# Patient Record
Sex: Male | Born: 1955 | Race: White | Hispanic: No | Smoking: Current some day smoker
Health system: Southern US, Community
[De-identification: ages and names within clinical notes are randomized; demographics above are authoritative.]

## PROBLEM LIST (undated history)

## (undated) DIAGNOSIS — R600 Localized edema: Secondary | ICD-10-CM

## (undated) DIAGNOSIS — G4730 Sleep apnea, unspecified: Secondary | ICD-10-CM

## (undated) DIAGNOSIS — R569 Unspecified convulsions: Secondary | ICD-10-CM

## (undated) DIAGNOSIS — K219 Gastro-esophageal reflux disease without esophagitis: Secondary | ICD-10-CM

## (undated) DIAGNOSIS — J449 Chronic obstructive pulmonary disease, unspecified: Secondary | ICD-10-CM

## (undated) DIAGNOSIS — I10 Essential (primary) hypertension: Secondary | ICD-10-CM

## (undated) DIAGNOSIS — J45909 Unspecified asthma, uncomplicated: Secondary | ICD-10-CM

## (undated) DIAGNOSIS — R0600 Dyspnea, unspecified: Secondary | ICD-10-CM

## (undated) DIAGNOSIS — Z720 Tobacco use: Secondary | ICD-10-CM

## (undated) HISTORY — PX: FOOT SURGERY: SHX648

## (undated) HISTORY — PX: BACK SURGERY: SHX140

## (undated) HISTORY — PX: HERNIA REPAIR: SHX51

---

## 2011-11-21 DIAGNOSIS — R55 Syncope and collapse: Secondary | ICD-10-CM

## 2013-10-17 ENCOUNTER — Ambulatory Visit: Payer: Self-pay | Admitting: Internal Medicine

## 2013-12-31 ENCOUNTER — Emergency Department (HOSPITAL_COMMUNITY): Payer: Self-pay

## 2013-12-31 ENCOUNTER — Inpatient Hospital Stay (HOSPITAL_COMMUNITY)
Admission: EM | Admit: 2013-12-31 | Discharge: 2014-01-02 | DRG: 192 | Disposition: A | Discharge status: Home / Self Care | Payer: Self-pay | Attending: Internal Medicine | Admitting: Internal Medicine

## 2013-12-31 ENCOUNTER — Encounter (HOSPITAL_COMMUNITY): Payer: Self-pay | Admitting: Emergency Medicine

## 2013-12-31 DIAGNOSIS — F17200 Nicotine dependence, unspecified, uncomplicated: Secondary | ICD-10-CM | POA: Diagnosis present

## 2013-12-31 DIAGNOSIS — J441 Chronic obstructive pulmonary disease with (acute) exacerbation: Principal | ICD-10-CM | POA: Diagnosis present

## 2013-12-31 DIAGNOSIS — J45901 Unspecified asthma with (acute) exacerbation: Principal | ICD-10-CM

## 2013-12-31 DIAGNOSIS — D72829 Elevated white blood cell count, unspecified: Secondary | ICD-10-CM | POA: Diagnosis present

## 2013-12-31 DIAGNOSIS — Z66 Do not resuscitate: Secondary | ICD-10-CM | POA: Diagnosis present

## 2013-12-31 DIAGNOSIS — G4730 Sleep apnea, unspecified: Secondary | ICD-10-CM | POA: Diagnosis present

## 2013-12-31 DIAGNOSIS — I10 Essential (primary) hypertension: Secondary | ICD-10-CM | POA: Diagnosis present

## 2013-12-31 DIAGNOSIS — Z23 Encounter for immunization: Secondary | ICD-10-CM

## 2013-12-31 HISTORY — DX: Essential (primary) hypertension: I10

## 2013-12-31 HISTORY — DX: Unspecified asthma, uncomplicated: J45.909

## 2013-12-31 HISTORY — DX: Sleep apnea, unspecified: G47.30

## 2013-12-31 HISTORY — DX: Chronic obstructive pulmonary disease, unspecified: J44.9

## 2013-12-31 LAB — CBC WITH DIFFERENTIAL/PLATELET
BASOS ABS: 0.1 10*3/uL (ref 0.0–0.1)
BASOS PCT: 0 % (ref 0–1)
Eosinophils Absolute: 0.4 10*3/uL (ref 0.0–0.7)
Eosinophils Relative: 2 % (ref 0–5)
HEMATOCRIT: 45.7 % (ref 39.0–52.0)
Hemoglobin: 15.9 g/dL (ref 13.0–17.0)
LYMPHS PCT: 13 % (ref 12–46)
Lymphs Abs: 2.1 10*3/uL (ref 0.7–4.0)
MCH: 31.1 pg (ref 26.0–34.0)
MCHC: 34.8 g/dL (ref 30.0–36.0)
MCV: 89.3 fL (ref 78.0–100.0)
MONO ABS: 1.8 10*3/uL — AB (ref 0.1–1.0)
Monocytes Relative: 12 % (ref 3–12)
NEUTROS PCT: 73 % (ref 43–77)
Neutro Abs: 11.4 10*3/uL — ABNORMAL HIGH (ref 1.7–7.7)
Platelets: 270 10*3/uL (ref 150–400)
RBC: 5.12 MIL/uL (ref 4.22–5.81)
RDW: 13.8 % (ref 11.5–15.5)
WBC: 15.8 10*3/uL — AB (ref 4.0–10.5)

## 2013-12-31 LAB — BASIC METABOLIC PANEL
ANION GAP: 10 (ref 5–15)
BUN: 10 mg/dL (ref 6–23)
CO2: 29 mEq/L (ref 19–32)
CREATININE: 0.9 mg/dL (ref 0.50–1.35)
Calcium: 9.4 mg/dL (ref 8.4–10.5)
Chloride: 94 mEq/L — ABNORMAL LOW (ref 96–112)
Glucose, Bld: 94 mg/dL (ref 70–99)
Potassium: 3.9 mEq/L (ref 3.7–5.3)
SODIUM: 133 meq/L — AB (ref 137–147)

## 2013-12-31 MED ORDER — ALBUTEROL (5 MG/ML) CONTINUOUS INHALATION SOLN
INHALATION_SOLUTION | RESPIRATORY_TRACT | Status: AC
  Filled 2013-12-31: qty 20

## 2013-12-31 MED ORDER — METHYLPREDNISOLONE SODIUM SUCC 125 MG IJ SOLR
125.0000 mg | Freq: Once | INTRAMUSCULAR | Status: AC
  Administered 2013-12-31: 125 mg via INTRAVENOUS
  Filled 2013-12-31: qty 2

## 2013-12-31 MED ORDER — IPRATROPIUM BROMIDE 0.02 % IN SOLN: 0.5000 mg | Freq: Four times a day (QID) | RESPIRATORY_TRACT | Status: DC

## 2013-12-31 MED ORDER — IPRATROPIUM-ALBUTEROL 0.5-2.5 (3) MG/3ML IN SOLN
3.0000 mL | Freq: Once | RESPIRATORY_TRACT | Status: AC
  Administered 2013-12-31: 3 mL via RESPIRATORY_TRACT
  Filled 2013-12-31: qty 3

## 2013-12-31 MED ORDER — ALBUTEROL SULFATE (2.5 MG/3ML) 0.083% IN NEBU
2.5000 mg | INHALATION_SOLUTION | Freq: Once | RESPIRATORY_TRACT | Status: AC
  Administered 2013-12-31: 2.5 mg via RESPIRATORY_TRACT
  Filled 2013-12-31: qty 3

## 2013-12-31 MED ORDER — IPRATROPIUM-ALBUTEROL 0.5-2.5 (3) MG/3ML IN SOLN: 3.0000 mL | Freq: Four times a day (QID) | RESPIRATORY_TRACT | Status: DC

## 2013-12-31 MED ORDER — ENOXAPARIN SODIUM 40 MG/0.4ML ~~LOC~~ SOLN
40.0000 mg | SUBCUTANEOUS | Status: DC
  Administered 2014-01-01 – 2014-01-02 (×2): 40 mg via SUBCUTANEOUS
  Filled 2013-12-31 (×2): qty 0.4

## 2013-12-31 MED ORDER — ONDANSETRON HCL 4 MG PO TABS: 4.0000 mg | ORAL_TABLET | Freq: Four times a day (QID) | ORAL | Status: DC | PRN

## 2013-12-31 MED ORDER — ALBUTEROL SULFATE (2.5 MG/3ML) 0.083% IN NEBU
2.5000 mg | INHALATION_SOLUTION | RESPIRATORY_TRACT | Status: DC | PRN
  Administered 2014-01-01: 2.5 mg via RESPIRATORY_TRACT
  Filled 2013-12-31: qty 3

## 2013-12-31 MED ORDER — DM-GUAIFENESIN ER 30-600 MG PO TB12
1.0000 | ORAL_TABLET | Freq: Two times a day (BID) | ORAL | Status: DC
  Administered 2014-01-01 – 2014-01-02 (×4): 1 via ORAL
  Filled 2013-12-31 (×4): qty 1

## 2013-12-31 MED ORDER — INFLUENZA VAC SPLIT QUAD 0.5 ML IM SUSY
0.5000 mL | PREFILLED_SYRINGE | INTRAMUSCULAR | Status: AC
  Administered 2014-01-01: 0.5 mL via INTRAMUSCULAR
  Filled 2013-12-31: qty 0.5

## 2013-12-31 MED ORDER — IPRATROPIUM-ALBUTEROL 0.5-2.5 (3) MG/3ML IN SOLN
RESPIRATORY_TRACT | Status: AC
  Administered 2013-12-31: 3 mL
  Filled 2013-12-31: qty 3

## 2013-12-31 MED ORDER — ALBUTEROL (5 MG/ML) CONTINUOUS INHALATION SOLN
10.0000 mg/h | INHALATION_SOLUTION | RESPIRATORY_TRACT | Status: DC
  Administered 2013-12-31: 10 mg/h via RESPIRATORY_TRACT

## 2013-12-31 MED ORDER — METHYLPREDNISOLONE SODIUM SUCC 125 MG IJ SOLR
60.0000 mg | Freq: Four times a day (QID) | INTRAMUSCULAR | Status: DC
  Administered 2014-01-01 – 2014-01-02 (×7): 60 mg via INTRAVENOUS
  Filled 2013-12-31 (×8): qty 2

## 2013-12-31 MED ORDER — SODIUM CHLORIDE 0.9 % IV SOLN: 250.0000 mL | INTRAVENOUS | Status: DC | PRN

## 2013-12-31 MED ORDER — SODIUM CHLORIDE 0.9 % IJ SOLN
3.0000 mL | Freq: Two times a day (BID) | INTRAMUSCULAR | Status: DC
  Administered 2014-01-01 – 2014-01-02 (×3): 3 mL via INTRAVENOUS

## 2013-12-31 MED ORDER — SODIUM CHLORIDE 0.9 % IJ SOLN: 3.0000 mL | INTRAMUSCULAR | Status: DC | PRN

## 2013-12-31 MED ORDER — ACETAMINOPHEN 325 MG PO TABS: 650.0000 mg | ORAL_TABLET | Freq: Four times a day (QID) | ORAL | Status: DC | PRN

## 2013-12-31 MED ORDER — ACETAMINOPHEN 650 MG RE SUPP: 650.0000 mg | Freq: Four times a day (QID) | RECTAL | Status: DC | PRN

## 2013-12-31 MED ORDER — LEVOFLOXACIN IN D5W 500 MG/100ML IV SOLN
500.0000 mg | INTRAVENOUS | Status: DC
  Administered 2014-01-01 (×2): 500 mg via INTRAVENOUS
  Filled 2013-12-31 (×3): qty 100

## 2013-12-31 MED ORDER — LEVOFLOXACIN IN D5W 500 MG/100ML IV SOLN
INTRAVENOUS | Status: AC
  Filled 2013-12-31: qty 100

## 2013-12-31 MED ORDER — ONDANSETRON HCL 4 MG/2ML IJ SOLN: 4.0000 mg | Freq: Four times a day (QID) | INTRAMUSCULAR | Status: DC | PRN

## 2013-12-31 MED ORDER — ALBUTEROL SULFATE (2.5 MG/3ML) 0.083% IN NEBU: 2.5000 mg | INHALATION_SOLUTION | Freq: Four times a day (QID) | RESPIRATORY_TRACT | Status: DC

## 2013-12-31 MED ORDER — PNEUMOCOCCAL VAC POLYVALENT 25 MCG/0.5ML IJ INJ
0.5000 mL | INJECTION | INTRAMUSCULAR | Status: AC
  Administered 2014-01-01: 0.5 mL via INTRAMUSCULAR
  Filled 2013-12-31: qty 0.5

## 2013-12-31 NOTE — Progress Notes (Signed)
Patient has an allergy to sudafed and it is contraindicated to give biotene mouth wash.

## 2013-12-31 NOTE — ED Notes (Signed)
Patient has had some difficulty breathing for the past few months. His work of breathing increased last night. Patient has cough present with some sputum production. Pt states he is having chest pain but feels it comes from his increased need to breathe.

## 2013-12-31 NOTE — H&P (Signed)
PCP:   No primary provider on file.   Chief Complaint:  Shortness of breath  HPI:  58 year old male who  has a past medical history of COPD (chronic obstructive pulmonary disease); Asthma; Hypertension; and Sleep apnea. today presents to the ED with chief complaint of shortness of breath for past one week. Patient has a history of COPD and is not very compliant with his home medications as he is unable to afford those medications. He has been having worsening shortness of breath especially on exertion also associated with productive cough and yellow-colored sputum. He denies fever but has been having chills. Denies nausea vomiting or diarrhea. Patient quit smoking 2 weeks ago. He also has hypertension history but has not been taking these medications as prescribed. In the ED patient was found to be in COPD exacerbation and was started on albuterol continuous nebulizers.  Allergies:   Allergies  Allergen Reactions  . Sudafed [Pseudoephedrine Hcl] Rash      Past Medical History  Diagnosis Date  . COPD (chronic obstructive pulmonary disease)   . Asthma   . Hypertension   . Sleep apnea     Past Surgical History  Procedure Laterality Date  . Back surgery    . Hernia repair      Prior to Admission medications   Medication Sig Start Date End Date Taking? Authorizing Provider  tiotropium (SPIRIVA) 18 MCG inhalation capsule Place 18 mcg into inhaler and inhale daily.   Yes Historical Provider, MD    Social History:  reports that he quit smoking 7 days ago. He does not have any smokeless tobacco history on file. He reports that he does not drink alcohol or use illicit drugs.    All the positives are listed in BOLD  Review of Systems:  HEENT: Headache, blurred vision, runny nose, sore throat Neck: Hypothyroidism, hyperthyroidism,,lymphadenopathy Chest : Shortness of breath, history of COPD, Asthma Heart : Chest pain, history of coronary arterey disease GI:  Nausea, vomiting,  diarrhea, constipation, GERD GU: Dysuria, urgency, frequency of urination, hematuria Neuro: Stroke, seizures, syncope Psych: Depression, anxiety, hallucinations   Physical Exam: Blood pressure 144/87, pulse 106, temperature 98.8 F (37.1 C), temperature source Oral, resp. rate 20, height  (1.727 m), weight 86.183 kg (190 lb), SpO2 94.00%. Constitutional:   Patient is a well-developed and well-nourished male* in no acute distress and cooperative with exam. Head: Normocephalic and atraumatic Mouth: Mucus membranes moist Eyes: PERRL, EOMI, conjunctivae normal Neck: Supple, No Thyromegaly Cardiovascular: RRR, S1 normal, S2 normal Pulmonary/Chest: Bilateral wheezing Abdominal: Soft. Non-tender, non-distended, bowel sounds are normal, no masses, organomegaly, or guarding present.  Neurological: A&O x3, Strenght is normal and symmetric bilaterally, cranial nerve II-XII are grossly intact, no focal motor deficit, sensory intact to light touch bilaterally.  Extremities : No Cyanosis, Clubbing or Edema  Labs on Admission:  Basic Metabolic Panel:  Recent Labs Lab 12/31/13 1738  NA 133*  K 3.9  CL 94*  CO2 29  GLUCOSE 94  BUN 10  CREATININE 0.90  CALCIUM 9.4   CBC:  Recent Labs Lab 12/31/13 1738  WBC 15.8*  NEUTROABS 11.4*  HGB 15.9  HCT 45.7  MCV 89.3  PLT 270   Radiological Exams on Admission: Dg Chest 2 View  12/31/2013   CLINICAL DATA:  Shortness of breath  EXAM: CHEST  2 VIEW  COMPARISON:  11/28/2013  FINDINGS: The heart size and mediastinal contours are within normal limits. Both lungs are clear. The visualized skeletal structures are unremarkable.  IMPRESSION: No active cardiopulmonary disease.   Electronically Signed   By: Elige Ko   On: 12/31/2013 20:15    EKG: Independently reviewed. Sinus rhythm   Assessment/Plan Principal Problem:   COPD exacerbation  COPD exacerbation We'll admit the patient to the hospital and start DuoNeb nebulizers every 6  hours, IV Levaquin 500 mg daily,  Solu-Medrol 60 mg IV every 6 hours, Mucinex DM one tablet by mouth twice a day.  History of hypertension Blood pressure is stable, we'll start by mouth hydralazine 25 mg every 6 hours when necessary for BP greater than 160/100.  Code status: Patient is DO NOT RESUSCITATE  Family discussion: No family at bedside   Time Spent on Admission: 55 minutes  Zorina Mallin S [facility] Pager: [phone] 12/31/2013, 10:09 PM  If 7PM-7AM, please contact night-coverage  [email or web address]  Password [email or web address]

## 2013-12-31 NOTE — ED Provider Notes (Signed)
CSN: [id number]     Arrival date & time 12/31/13  1706 History   First MD Initiated Contact with Patient 12/31/13 1717    This chart was scribed for Donnetta Hutching, MD by Marica Otter, ED Scribe. This patient was seen in room APA19/APA19 and the patient's care was started at 5:22 PM.  Chief Complaint  Patient presents with  . Shortness of Breath   The history is provided by the patient. No language interpreter was used.   HPI Comments: PCP: No primary provider on file.  ARVIL UTZ is a 58 y.o. male, with Hx of asthma, cigarette smoker (quit date one week ago), COPD, HTN and sleep apnea, and who presents to the Emergency Department complaining of SOB onset a year ago, however, worsening in the past week. Pt also complains of associated productive cough with discolored sputum. No fever or chills. Patient gets dyspneic with exertion. No chest pain. He has been a long-time smoker. Severity is moderate to severe.  Past Medical History  Diagnosis Date  . COPD (chronic obstructive pulmonary disease)   . Asthma   . Hypertension   . Sleep apnea    Past Surgical History  Procedure Laterality Date  . Back surgery    . Hernia repair     History reviewed. No pertinent family history. History  Substance Use Topics  . Smoking status: Former Smoker    Quit date: 12/24/2013  . Smokeless tobacco: Not on file  . Alcohol Use: No    Review of Systems  A complete 10 system review of systems was obtained and all systems are negative except as noted in the HPI and PMH.    Allergies  Sudafed  Home Medications   Prior to Admission medications   Medication Sig Start Date End Date Taking? Authorizing Provider  tiotropium (SPIRIVA) 18 MCG inhalation capsule Place 18 mcg into inhaler and inhale daily.   Yes Historical Provider, MD   Triage Vitals: BP 164/94  Pulse 95  Temp(Src) 98.8 F (37.1 C) (Oral)  Resp 30  Ht  (1.727 m)  Wt 190 lb (86.183 kg)  BMI 28.90 kg/m2  SpO2  94% Physical Exam  Nursing note and vitals reviewed. Constitutional: He is oriented to person, place, and time. He appears well-developed and well-nourished. No distress.  HENT:  Head: Normocephalic and atraumatic.  Eyes: Conjunctivae and EOM are normal.  Cardiovascular: Normal rate.   Pulmonary/Chest: Effort normal. No respiratory distress. He has wheezes (bilaterally ).  tachypnic and dyspneic.   Musculoskeletal: Normal range of motion.  Neurological: He is alert and oriented to person, place, and time.  Skin: Skin is warm and dry.  Psychiatric: He has a normal mood and affect. His behavior is normal.    ED Course  Procedures (including critical care time) DIAGNOSTIC STUDIES: Oxygen Saturation is 94% on RA, adequate by my interpretation.    COORDINATION OF CARE: 5:27 PM-Discussed treatment plan which includes breathing treatment and meds with pt at bedside and pt agreed to plan.   Labs Review Labs Reviewed  BASIC METABOLIC PANEL - Abnormal; Notable for the following:    Sodium 133 (*)    Chloride 94 (*)    All other components within normal limits  CBC WITH DIFFERENTIAL - Abnormal; Notable for the following:    WBC 15.8 (*)    Neutro Abs 11.4 (*)    Monocytes Absolute 1.8 (*)    All other components within normal limits    Imaging Review Dg Chest  2 View  12/31/2013   CLINICAL DATA:  Shortness of breath  EXAM: CHEST  2 VIEW  COMPARISON:  11/28/2013  FINDINGS: The heart size and mediastinal contours are within normal limits. Both lungs are clear. The visualized skeletal structures are unremarkable.  IMPRESSION: No active cardiopulmonary disease.   Electronically Signed   By: Elige Ko   On: 12/31/2013 20:15     EKG Interpretation None      Date: 12/31/2013  Rate: 95  Rhythm: normal sinus rhythm  QRS Axis: normal  Intervals: normal  ST/T Wave abnormalities: normal  Conduction Disutrbances: none  Narrative Interpretation: unremarkable    MDM   Final  diagnoses:  COPD exacerbation    Patient continues to be dyspneic, tachypnea, wheezing despite a hour-long Albuterol/Atrovent nebulizer treatment. IV steroids. Admit to general medicine  I personally performed the services described in this documentation, which was scribed in my presence. The recorded information has been reviewed and is accurate.    Donnetta Hutching, MD 12/31/13 2147

## 2014-01-01 DIAGNOSIS — I10 Essential (primary) hypertension: Secondary | ICD-10-CM

## 2014-01-01 LAB — URINALYSIS, ROUTINE W REFLEX MICROSCOPIC
Bilirubin Urine: NEGATIVE
Glucose, UA: NEGATIVE mg/dL
Hgb urine dipstick: NEGATIVE
Ketones, ur: NEGATIVE mg/dL
Leukocytes, UA: NEGATIVE
Nitrite: NEGATIVE
Protein, ur: NEGATIVE mg/dL
Specific Gravity, Urine: 1.025 (ref 1.005–1.030)
Urobilinogen, UA: 0.2 mg/dL (ref 0.0–1.0)
pH: 5.5 (ref 5.0–8.0)

## 2014-01-01 LAB — COMPREHENSIVE METABOLIC PANEL WITH GFR
ALT: 19 U/L (ref 0–53)
AST: 12 U/L (ref 0–37)
Albumin: 3.4 g/dL — ABNORMAL LOW (ref 3.5–5.2)
Alkaline Phosphatase: 81 U/L (ref 39–117)
Anion gap: 12 (ref 5–15)
BUN: 10 mg/dL (ref 6–23)
CO2: 26 meq/L (ref 19–32)
Calcium: 9.4 mg/dL (ref 8.4–10.5)
Chloride: 93 meq/L — ABNORMAL LOW (ref 96–112)
Creatinine, Ser: 0.71 mg/dL (ref 0.50–1.35)
GFR calc Af Amer: >90 mL/min
GFR calc non Af Amer: >90 mL/min
Glucose, Bld: 145 mg/dL — ABNORMAL HIGH (ref 70–99)
Potassium: 4.4 meq/L (ref 3.7–5.3)
Sodium: 131 meq/L — ABNORMAL LOW (ref 137–147)
Total Bilirubin: 0.4 mg/dL (ref 0.3–1.2)
Total Protein: 7.2 g/dL (ref 6.0–8.3)

## 2014-01-01 LAB — CBC
HEMATOCRIT: 46.5 % (ref 39.0–52.0)
Hemoglobin: 16.1 g/dL (ref 13.0–17.0)
MCH: 30.7 pg (ref 26.0–34.0)
MCHC: 34.6 g/dL (ref 30.0–36.0)
MCV: 88.6 fL (ref 78.0–100.0)
Platelets: 279 10*3/uL (ref 150–400)
RBC: 5.25 MIL/uL (ref 4.22–5.81)
RDW: 13.7 % (ref 11.5–15.5)
WBC: 14.4 10*3/uL — ABNORMAL HIGH (ref 4.0–10.5)

## 2014-01-01 MED ORDER — IPRATROPIUM-ALBUTEROL 0.5-2.5 (3) MG/3ML IN SOLN
3.0000 mL | Freq: Four times a day (QID) | RESPIRATORY_TRACT | Status: DC
  Administered 2014-01-01 – 2014-01-02 (×6): 3 mL via RESPIRATORY_TRACT
  Filled 2014-01-01 (×6): qty 3

## 2014-01-01 NOTE — Progress Notes (Signed)
[facility]                                                                              Patient Demographics  Troy Bates, is a 58 y.o. male, DOB - 1955/10/18, ZOX:[id number]  Admit date - 12/31/2013   Admitting Physician Meredeth Ide, MD  Outpatient Primary MD for the patient is No primary provider on file.  LOS - 1   Chief Complaint  Patient presents with  . Shortness of Breath      HPI on 12/31/2013 This 58 year old male past medical history of COPD, hypertension, sleep apnea that presented to the emergency department with a chief complaint of shortness of breath for one week. Patient has a history of COPD he does not comply with his home medications and was unable to afford his medications. He had worsening shortness of breath especially on exertion and associated with productive cough, yellow-colored sputum. Patient not having any fevers or chills. He denied any nausea, vomiting, diarrhea. Patient did quit smoking approximately 2 weeks ago. Patient also has a history of hypertension meds not taking his medications. In the emergency department, patient have COPD exacerbation and started on albuterol and continuous nebulizers.  Assessment & Plan   COPD exacerbation -Chest x-ray: No active cardiopulmonary disease -Continue duo nebs, Levaquin, Solu-Medrol, Mucinex  Leukocytosis -Likely reactive, continue to monitor CBC -Will obtain UA and blood cultures to rule out infection  History of Hypertension -Stable, continue hydralazine as needed  Code Status: Full  Family Communication: None at bedside  Disposition Plan: Admitted  Time Spent in minutes   30 minutes  Procedures  None  Consults   None  DVT Prophylaxis  Lovenox  Lab Results  Component Value Date   PLT 279 01/01/2014    Medications  Scheduled Meds: . dextromethorphan-guaiFENesin  1 tablet Oral BID  . enoxaparin (LOVENOX) injection  40 mg Subcutaneous Q24H  . ipratropium-albuterol  3 mL  Nebulization Q6H  . levofloxacin (LEVAQUIN) IV  500 mg Intravenous Q24H  . methylPREDNISolone (SOLU-MEDROL) injection  60 mg Intravenous Q6H  . sodium chloride  3 mL Intravenous Q12H   Continuous Infusions:  PRN Meds:.sodium chloride, acetaminophen, acetaminophen, albuterol, ondansetron (ZOFRAN) IV, ondansetron, sodium chloride  Antibiotics    Anti-infectives   Start     Dose/Rate Route Frequency Ordered Stop   12/31/13 2330  levofloxacin (LEVAQUIN) IVPB 500 mg     500 mg 100 mL/hr over 60 Minutes Intravenous Every 24 hours 12/31/13 2327          Subjective:   Troy Bates seen and examined today.  Patient states he continues to cough. He feels his shortness of breath is unchanged.  He currently denies dizziness, headache, chest or abdominal pain.  Objective:   Filed Vitals:   01/01/14 0652 01/01/14 0807 01/01/14 1326 01/01/14 1403  BP:   137/81   Pulse:  92 97   Temp:   98.5 F (36.9 C)   TempSrc:   Oral   Resp:  19 18   Height:      Weight:      SpO2: 92% 92% 91% 92%    Wt Readings from Last 3 Encounters:  01/01/14 82.645  kg (182 lb 3.2 oz)     Intake/Output Summary (Last 24 hours) at 01/01/14 1500 Last data filed at 01/01/14 1200  Gross per 24 hour  Intake    240 ml  Output      0 ml  Net    240 ml    Exam  General: Well developed, well nourished, NAD, appears stated age  HEENT: NCAT, PERRLA, EOMI, Anicteic Sclera, mucous membranes moist.   Cardiovascular: S1 S2 auscultated, no rubs, murmurs or gallops. Regular rate and rhythm.  Respiratory: Diffuse wheezing.   Abdomen: Soft, nontender, nondistended, + bowel sounds  Extremities: warm dry without cyanosis clubbing or edema  Neuro: AAOx3, cranial nerves grossly intact. Strength equal and bilateral in upper/lower ext.  Data Review   Micro Results No results found for this or any previous visit (from the past 240 hour(s)).  Radiology Reports Dg Chest 2 View  12/31/2013   CLINICAL DATA:   Shortness of breath  EXAM: CHEST  2 VIEW  COMPARISON:  11/28/2013  FINDINGS: The heart size and mediastinal contours are within normal limits. Both lungs are clear. The visualized skeletal structures are unremarkable.  IMPRESSION: No active cardiopulmonary disease.   Electronically Signed   By: Elige Ko   On: 12/31/2013 20:15    CBC  Recent Labs Lab 12/31/13 1738 01/01/14 0625  WBC 15.8* 14.4*  HGB 15.9 16.1  HCT 45.7 46.5  PLT 270 279  MCV 89.3 88.6  MCH 31.1 30.7  MCHC 34.8 34.6  RDW 13.8 13.7  LYMPHSABS 2.1  --   MONOABS 1.8*  --   EOSABS 0.4  --   BASOSABS 0.1  --     Chemistries   Recent Labs Lab 12/31/13 1738 01/01/14 0625  NA 133* 131*  K 3.9 4.4  CL 94* 93*  CO2 29 26  GLUCOSE 94 145*  BUN 10 10  CREATININE 0.90 0.71  CALCIUM 9.4 9.4  AST  --  12  ALT  --  19  ALKPHOS  --  81  BILITOT  --  0.4   ------------------------------------------------------------------------------------------------------------------ estimated creatinine clearance is 104.2 ml/min (by C-G formula based on Cr of 0.71). ------------------------------------------------------------------------------------------------------------------ No results found for this basename: HGBA1C,  in the last 72 hours ------------------------------------------------------------------------------------------------------------------ No results found for this basename: CHOL, HDL, LDLCALC, TRIG, CHOLHDL, LDLDIRECT,  in the last 72 hours ------------------------------------------------------------------------------------------------------------------ No results found for this basename: TSH, T4TOTAL, FREET3, T3FREE, THYROIDAB,  in the last 72 hours ------------------------------------------------------------------------------------------------------------------ No results found for this basename: VITAMINB12, FOLATE, FERRITIN, TIBC, IRON, RETICCTPCT,  in the last 72 hours  Coagulation profile No results  found for this basename: INR, PROTIME,  in the last 168 hours  No results found for this basename: DDIMER,  in the last 72 hours  Cardiac Enzymes No results found for this basename: CK, CKMB, TROPONINI, MYOGLOBIN,  in the last 168 hours ------------------------------------------------------------------------------------------------------------------ No components found with this basename: POCBNP,     Troy Bates D.O. on 01/01/2014 at 3:00 PM  Between 7am to 7pm - Pager - [phone]  After 7pm go to [email or web address] - password [email or web address]  And look for the night coverage person covering for me after hours  [facility] Office  [phone]

## 2014-01-02 DIAGNOSIS — D72829 Elevated white blood cell count, unspecified: Secondary | ICD-10-CM

## 2014-01-02 LAB — CBC
HCT: 46.5 % (ref 39.0–52.0)
Hemoglobin: 16.3 g/dL (ref 13.0–17.0)
MCH: 31.2 pg (ref 26.0–34.0)
MCHC: 35.1 g/dL (ref 30.0–36.0)
MCV: 89.1 fL (ref 78.0–100.0)
PLATELETS: 311 10*3/uL (ref 150–400)
RBC: 5.22 MIL/uL (ref 4.22–5.81)
RDW: 14 % (ref 11.5–15.5)
WBC: 32 10*3/uL — ABNORMAL HIGH (ref 4.0–10.5)

## 2014-01-02 LAB — BASIC METABOLIC PANEL
ANION GAP: 14 (ref 5–15)
BUN: 17 mg/dL (ref 6–23)
CALCIUM: 9.7 mg/dL (ref 8.4–10.5)
CHLORIDE: 96 meq/L (ref 96–112)
CO2: 25 mEq/L (ref 19–32)
CREATININE: 0.78 mg/dL (ref 0.50–1.35)
GFR calc Af Amer: >90 mL/min (ref >90)
GFR calc non Af Amer: >90 mL/min (ref >90)
Glucose, Bld: 156 mg/dL — ABNORMAL HIGH (ref 70–99)
Potassium: 4.7 mEq/L (ref 3.7–5.3)
Sodium: 135 mEq/L — ABNORMAL LOW (ref 137–147)

## 2014-01-02 MED ORDER — IPRATROPIUM-ALBUTEROL 0.5-2.5 (3) MG/3ML IN SOLN: 3.0000 mL | Freq: Four times a day (QID) | RESPIRATORY_TRACT | Status: DC

## 2014-01-02 MED ORDER — DM-GUAIFENESIN ER 30-600 MG PO TB12: 1.0000 | ORAL_TABLET | Freq: Two times a day (BID) | ORAL | Status: DC

## 2014-01-02 MED ORDER — PREDNISONE (PAK) 10 MG PO TABS: ORAL_TABLET | Freq: Every day | ORAL | Status: DC

## 2014-01-02 MED ORDER — LEVOFLOXACIN 500 MG PO TABS
500.0000 mg | ORAL_TABLET | Freq: Every day | ORAL | Status: DC
Start: 2014-01-02 — End: 2014-05-20

## 2014-01-02 MED ORDER — ALBUTEROL SULFATE (2.5 MG/3ML) 0.083% IN NEBU: 2.5000 mg | INHALATION_SOLUTION | RESPIRATORY_TRACT | Status: AC | PRN

## 2014-01-02 NOTE — Progress Notes (Addendum)
At rest, pt. On 3L nasal cannula and O2 sats 93%. At rest on room air, patient oxygen sats 92%. Patient ambulated 100 ft on room air and oxygen sats dropped to 88% and patient very short of breath.  Patient placed back on 3 liters nasal cannula while ambulating and sats returned to 92%.    Notified MD and will continue to monitor patient.

## 2014-01-02 NOTE — Discharge Instructions (Signed)

## 2014-01-02 NOTE — Progress Notes (Signed)
Patient's IV was removed and was clean, dry, and intact.  Patient received discharge instructions and scripts and had no further questions/concerns.  Patient received oxygen from [facility] prior to discharge and was discharged home on oxygen.  Patient was escorted to vehicle via wheelchair by nurse.  Patient was in stable condition at discharge.

## 2014-01-02 NOTE — Discharge Summary (Signed)
Physician Discharge Summary  Troy Bates:[id number] DOB: [birth year] DOA: 12/31/2013  PCP: No primary provider on file.  Admit date: 12/31/2013 Discharge date: 01/02/2014  Time spent: 45 minutes  Recommendations for Outpatient Follow-up:  Patient will be discharged to home. He is to followup with his primary care physician within one week of discharge. He should have a repeat CBC within one week. Patient will be discharged with prescriptions for the nebulizer treatments as well as nebulizer, oxygen as well as antibiotics and steroid taper. Patient to continue these medications as prescribed. Patient should resume a heart healthy diet. He should continue physical activity as tolerated.  Discharge Diagnoses:  Principal Problem:   COPD exacerbation Leukocytosis Hypertension  Discharge Condition: Stable  Diet recommendation: heart healthy  Filed Weights   12/31/13 1711 01/01/14 0509  Weight: 86.183 kg (190 lb) 82.645 kg (182 lb 3.2 oz)    History of present illness:  on 12/31/2013  This 58 year old male past medical history of COPD, hypertension, sleep apnea that presented to the emergency department with a chief complaint of shortness of breath for one week. Patient has a history of COPD he does not comply with his home medications and was unable to afford his medications. He had worsening shortness of breath especially on exertion and associated with productive cough, yellow-colored sputum. Patient not having any fevers or chills. He denied any nausea, vomiting, diarrhea. Patient did quit smoking approximately 2 weeks ago. Patient also has a history of hypertension meds not taking his medications. In the emergency department, patient have COPD exacerbation and started on albuterol and continuous nebulizers.  Hospital Course:  COPD exacerbation  -Chest x-ray: No active cardiopulmonary disease  -Continue duo nebs, Levaquin, Mucinex  -Patient had O2 sat of 88% on room air while  ambulating -Will discharge patient on prednisone taper, nebulizer treatments with machine, antibiotics, and oxygen  Leukocytosis  -Likely reactive, continue to monitor CBC  -UA negative, CXR no active cardiopulmonary disease -blood culture shows no growth to date -patient should have repeat CBC within one week.  History of Hypertension  -Stable -Patient is not any home medications for HTN  Procedures:  None  Consultations:  None  Discharge Exam: Filed Vitals:   01/02/14 0504  BP: 126/73  Pulse: 92  Temp: 97.7 F (36.5 C)  Resp: 18   Exam  General: Well developed, well nourished, NAD, appears stated age  HEENT: NCAT, mucous membranes moist.  Cardiovascular: S1 S2 auscultated, no rubs, murmurs or gallops. Regular rate and rhythm.  Respiratory: Decreased wheezing, better air movement noted in lung fields  Abdomen: Soft, nontender, nondistended, + bowel sounds  Extremities: warm dry without cyanosis clubbing or edema  Neuro: AAOx3, no focal deficits  Discharge Instructions      Discharge Instructions   DME Nebulizer machine    Complete by:  As directed      Discharge instructions    Complete by:  As directed   Patient will be discharged to home. He is to followup with his primary care physician within one week of discharge. He should have a repeat CBC within one week. Patient will be discharged with prescriptions for the nebulizer treatments as well as nebulizer, oxygen as well as antibiotics and steroid taper. Patient to continue these medications as prescribed. Patient should resume a heart healthy diet. He should continue physical activity as tolerated.     For home use only DME oxygen    Complete by:  As directed   Mode or (Route):  Nasal cannula  Liters per Minute:  2  Frequency:  Continuous (stationary and portable oxygen unit needed)  Oxygen conserving device:  Yes  Oxygen delivery system:  Gas     Increase activity slowly    Complete by:  As directed              Medication List         albuterol (2.5 MG/3ML) 0.083% nebulizer solution  Commonly known as:  PROVENTIL  Take 3 mLs (2.5 mg total) by nebulization every 2 (two) hours as needed for wheezing.     dextromethorphan-guaiFENesin 30-600 MG per 12 hr tablet  Commonly known as:  MUCINEX DM  Take 1 tablet by mouth 2 (two) times daily.     ipratropium-albuterol 0.5-2.5 (3) MG/3ML Soln  Commonly known as:  DUONEB  Take 3 mLs by nebulization every 6 (six) hours.     levofloxacin 500 MG tablet  Commonly known as:  LEVAQUIN  Take 1 tablet (500 mg total) by mouth daily.     predniSONE 10 MG tablet  Commonly known as:  STERAPRED UNI-PAK  - Take by mouth daily. Prednisone dosing: Take  Prednisone  (4 tabs) x 3 days, then taper to  (3 tabs) x 3 days, then  (2 tabs) x 3days, then  (1 tab) x 3days, then OFF.  -   - Dispense:  30 tabs, refills: None     tiotropium 18 MCG inhalation capsule  Commonly known as:  SPIRIVA  Place 18 mcg into inhaler and inhale daily.       Allergies  Allergen Reactions  . Sudafed [Pseudoephedrine Hcl] Rash      The results of significant diagnostics from this hospitalization (including imaging, microbiology, ancillary and laboratory) are listed below for reference.    Significant Diagnostic Studies: Dg Chest 2 View  12/31/2013   CLINICAL DATA:  Shortness of breath  EXAM: CHEST  2 VIEW  COMPARISON:  11/28/2013  FINDINGS: The heart size and mediastinal contours are within normal limits. Both lungs are clear. The visualized skeletal structures are unremarkable.  IMPRESSION: No active cardiopulmonary disease.   Electronically Signed   By: Elige Ko   On: 12/31/2013 20:15    Microbiology: Recent Results (from the past 240 hour(s))  CULTURE, BLOOD (ROUTINE X 2)     Status: None   Collection Time    01/01/14  3:09 PM      Result Value Ref Range Status   Specimen Description BLOOD RIGHT ARM   Final   Special Requests BOTTLES DRAWN  AEROBIC AND ANAEROBIC 6CC   Final   Culture NO GROWTH 1 DAY   Final   Report Status PENDING   Incomplete  CULTURE, BLOOD (ROUTINE X 2)     Status: None   Collection Time    01/01/14  3:23 PM      Result Value Ref Range Status   Specimen Description BLOOD LEFT ARM   Final   Special Requests BOTTLES DRAWN AEROBIC AND ANAEROBIC 6CC   Final   Culture NO GROWTH 1 DAY   Final   Report Status PENDING   Incomplete     Labs: Basic Metabolic Panel:  Recent Labs Lab 12/31/13 1738 01/01/14 0625 01/02/14 0632  NA 133* 131* 135*  K 3.9 4.4 4.7  CL 94* 93* 96  CO2 GLUCOSE 94 145* 156*  BUN CREATININE 0.90 0.71 0.78  CALCIUM 9.4 9.4 9.7   Liver Function Tests:  Recent Labs Lab 01/01/14 0625  AST 12  ALT 19  ALKPHOS 81  BILITOT 0.4  PROT 7.2  ALBUMIN 3.4*   No results found for this basename: LIPASE, AMYLASE,  in the last 168 hours No results found for this basename: AMMONIA,  in the last 168 hours CBC:  Recent Labs Lab 12/31/13 1738 01/01/14 0625 01/02/14 0632  WBC 15.8* 14.4* 32.0*  NEUTROABS 11.4*  --   --   HGB 15.9 16.1 16.3  HCT 45.7 46.5 46.5  MCV 89.3 88.6 89.1  PLT 270 279 311   Cardiac Enzymes: No results found for this basename: CKTOTAL, CKMB, CKMBINDEX, TROPONINI,  in the last 168 hours BNP: BNP (last 3 results) No results found for this basename: PROBNP,  in the last 8760 hours CBG: No results found for this basename: GLUCAP,  in the last 168 hours     Signed:  Edsel Petrin  [facility] 01/02/2014, 1:55 PM

## 2014-01-02 NOTE — Care Management Note (Addendum)
    Page 1 of 2   01/02/2014     2:22:31 PM CARE MANAGEMENT NOTE 01/02/2014  Patient:  Troy Bates, Troy Bates   Account Number:  [id number]  Date Initiated:  01/02/2014  Documentation initiated by:  Sharrie Rothman  Subjective/Objective Assessment:   Pt admitted from home with COPD. Pt lives with his wife and will return home at discharge. Pt is independent with ADl's. Pts PCP is Dr. Mayford Knife in [place].     Action/Plan:   Pt qualifies for home O2. Will obtain through [facility] and Emma with [facility] is aware. Pt may need neb machine as well. MATCH voucher will be given to obtain meds. Financial counsleor has contacted pt.   Anticipated DC Date:  01/03/2014   Anticipated DC Plan:  HOME/SELF CARE  In-house referral  Financial Counselor      DC Planning Services  CM consult  MATCH Program      [facility] Choice  DURABLE MEDICAL EQUIPMENT   Choice offered to / List presented to:  C-1 Patient   DME arranged  OXYGEN  NEBULIZER MACHINE      DME agency  [facility]        Status of service:  Completed, signed off Medicare Important Message given?   (If response is "NO", the following Medicare IM given date fields will be blank) Date Medicare IM given:   Medicare IM given by:   Date Additional Medicare IM given:   Additional Medicare IM given by:    Discharge Disposition:  HOME/SELF CARE  Per UR Regulation:    If discussed at Long Length of Stay Meetings, dates discussed:    Comments:  01/02/14 1415 Arlyss Queen, RN BSN CM Pt discharged home today with home O2 and neb machine from [facility]. Kara Mead will deliver pts portable to room and neb machine will be delivered to pts home with rest of O2 equipement at discharge. Pt and pts RN aware of discharge arrangements.  01/02/14 1315 Arlyss Queen, RN BSN CM

## 2014-01-08 LAB — CULTURE, BLOOD (ROUTINE X 2)
Culture: NO GROWTH
Culture: NO GROWTH

## 2014-05-18 ENCOUNTER — Encounter (HOSPITAL_COMMUNITY): Payer: Self-pay | Admitting: Emergency Medicine

## 2014-05-18 ENCOUNTER — Inpatient Hospital Stay (HOSPITAL_COMMUNITY)
Admission: EM | Admit: 2014-05-18 | Discharge: 2014-05-20 | DRG: 190 | Disposition: A | Discharge status: Home / Self Care | Payer: Self-pay | Attending: Internal Medicine | Admitting: Internal Medicine

## 2014-05-18 ENCOUNTER — Emergency Department (HOSPITAL_COMMUNITY): Payer: Self-pay

## 2014-05-18 DIAGNOSIS — I10 Essential (primary) hypertension: Secondary | ICD-10-CM | POA: Diagnosis present

## 2014-05-18 DIAGNOSIS — Z9981 Dependence on supplemental oxygen: Secondary | ICD-10-CM

## 2014-05-18 DIAGNOSIS — J45909 Unspecified asthma, uncomplicated: Secondary | ICD-10-CM | POA: Diagnosis present

## 2014-05-18 DIAGNOSIS — J441 Chronic obstructive pulmonary disease with (acute) exacerbation: Principal | ICD-10-CM | POA: Diagnosis present

## 2014-05-18 DIAGNOSIS — Z599 Problem related to housing and economic circumstances, unspecified: Secondary | ICD-10-CM

## 2014-05-18 DIAGNOSIS — J9620 Acute and chronic respiratory failure, unspecified whether with hypoxia or hypercapnia: Secondary | ICD-10-CM

## 2014-05-18 DIAGNOSIS — G4730 Sleep apnea, unspecified: Secondary | ICD-10-CM | POA: Diagnosis present

## 2014-05-18 DIAGNOSIS — F17210 Nicotine dependence, cigarettes, uncomplicated: Secondary | ICD-10-CM

## 2014-05-18 LAB — COMPREHENSIVE METABOLIC PANEL
ALT: 14 U/L (ref 0–53)
ANION GAP: 6 (ref 5–15)
AST: 13 U/L (ref 0–37)
Albumin: 3.5 g/dL (ref 3.5–5.2)
Alkaline Phosphatase: 67 U/L (ref 39–117)
BILIRUBIN TOTAL: 0.8 mg/dL (ref 0.3–1.2)
BUN: 7 mg/dL (ref 6–23)
CO2: 30 mmol/L (ref 19–32)
Calcium: 8.8 mg/dL (ref 8.4–10.5)
Chloride: 99 mmol/L (ref 96–112)
Creatinine, Ser: 0.98 mg/dL (ref 0.50–1.35)
GFR, EST NON AFRICAN AMERICAN: 88 mL/min — AB (ref >90)
Glucose, Bld: 106 mg/dL — ABNORMAL HIGH (ref 70–99)
Potassium: 3.8 mmol/L (ref 3.5–5.1)
SODIUM: 135 mmol/L (ref 135–145)
Total Protein: 6.5 g/dL (ref 6.0–8.3)

## 2014-05-18 LAB — CBC WITH DIFFERENTIAL/PLATELET
Basophils Absolute: 0.1 10*3/uL (ref 0.0–0.1)
Basophils Relative: 1 % (ref 0–1)
EOS PCT: 5 % (ref 0–5)
Eosinophils Absolute: 0.5 10*3/uL (ref 0.0–0.7)
HCT: 47.2 % (ref 39.0–52.0)
HEMOGLOBIN: 16.2 g/dL (ref 13.0–17.0)
LYMPHS PCT: 27 % (ref 12–46)
Lymphs Abs: 2.7 10*3/uL (ref 0.7–4.0)
MCH: 31 pg (ref 26.0–34.0)
MCHC: 34.3 g/dL (ref 30.0–36.0)
MCV: 90.2 fL (ref 78.0–100.0)
MONO ABS: 1 10*3/uL (ref 0.1–1.0)
Monocytes Relative: 10 % (ref 3–12)
Neutro Abs: 5.7 10*3/uL (ref 1.7–7.7)
Neutrophils Relative %: 57 % (ref 43–77)
Platelets: 224 10*3/uL (ref 150–400)
RBC: 5.23 MIL/uL (ref 4.22–5.81)
RDW: 13.7 % (ref 11.5–15.5)
WBC: 10 10*3/uL (ref 4.0–10.5)

## 2014-05-18 LAB — TROPONIN I: Troponin I: <0.03 ng/mL (ref <0.031)

## 2014-05-18 LAB — BLOOD GAS, ARTERIAL
ACID-BASE EXCESS: 4.4 mmol/L — AB (ref 0.0–2.0)
BICARBONATE: 28.8 meq/L — AB (ref 20.0–24.0)
DRAWN BY: 22179
FIO2: 21 %
O2 SAT: 92.5 %
PH ART: 7.415 (ref 7.350–7.450)
Patient temperature: 37
TCO2: 24.2 mmol/L (ref 0–100)
pCO2 arterial: 45.7 mmHg — ABNORMAL HIGH (ref 35.0–45.0)
pO2, Arterial: 63.6 mmHg — ABNORMAL LOW (ref 80.0–100.0)

## 2014-05-18 LAB — BRAIN NATRIURETIC PEPTIDE: B NATRIURETIC PEPTIDE 5: 33 pg/mL (ref 0.0–100.0)

## 2014-05-18 MED ORDER — METHYLPREDNISOLONE SODIUM SUCC 125 MG IJ SOLR
125.0000 mg | Freq: Once | INTRAMUSCULAR | Status: AC
  Administered 2014-05-18: 125 mg via INTRAVENOUS
  Filled 2014-05-18: qty 2

## 2014-05-18 MED ORDER — LEVOFLOXACIN IN D5W 500 MG/100ML IV SOLN
500.0000 mg | INTRAVENOUS | Status: DC
  Administered 2014-05-18 – 2014-05-19 (×2): 500 mg via INTRAVENOUS
  Filled 2014-05-18 (×2): qty 100

## 2014-05-18 MED ORDER — ONDANSETRON HCL 4 MG PO TABS: 4.0000 mg | ORAL_TABLET | Freq: Four times a day (QID) | ORAL | Status: DC | PRN

## 2014-05-18 MED ORDER — ONDANSETRON HCL 4 MG/2ML IJ SOLN: 4.0000 mg | Freq: Four times a day (QID) | INTRAMUSCULAR | Status: DC | PRN

## 2014-05-18 MED ORDER — HEPARIN SODIUM (PORCINE) 5000 UNIT/ML IJ SOLN
5000.0000 [IU] | Freq: Three times a day (TID) | INTRAMUSCULAR | Status: DC
  Administered 2014-05-18 – 2014-05-20 (×6): 5000 [IU] via SUBCUTANEOUS
  Filled 2014-05-18 (×6): qty 1

## 2014-05-18 MED ORDER — METHYLPREDNISOLONE SODIUM SUCC 125 MG IJ SOLR
125.0000 mg | Freq: Four times a day (QID) | INTRAMUSCULAR | Status: DC
  Administered 2014-05-18 – 2014-05-19 (×3): 125 mg via INTRAVENOUS
  Filled 2014-05-18 (×4): qty 2

## 2014-05-18 MED ORDER — IPRATROPIUM-ALBUTEROL 0.5-2.5 (3) MG/3ML IN SOLN
3.0000 mL | Freq: Four times a day (QID) | RESPIRATORY_TRACT | Status: DC
  Administered 2014-05-18 – 2014-05-20 (×8): 3 mL via RESPIRATORY_TRACT
  Filled 2014-05-18 (×8): qty 3

## 2014-05-18 MED ORDER — IPRATROPIUM-ALBUTEROL 0.5-2.5 (3) MG/3ML IN SOLN
3.0000 mL | Freq: Once | RESPIRATORY_TRACT | Status: AC
  Administered 2014-05-18: 3 mL via RESPIRATORY_TRACT
  Filled 2014-05-18: qty 3

## 2014-05-18 MED ORDER — ALBUTEROL (5 MG/ML) CONTINUOUS INHALATION SOLN
15.0000 mg/h | INHALATION_SOLUTION | RESPIRATORY_TRACT | Status: DC
  Administered 2014-05-18: 15 mg/h via RESPIRATORY_TRACT
  Filled 2014-05-18: qty 20

## 2014-05-18 MED ORDER — ALBUTEROL SULFATE (2.5 MG/3ML) 0.083% IN NEBU: 2.5000 mg | INHALATION_SOLUTION | RESPIRATORY_TRACT | Status: DC | PRN

## 2014-05-18 NOTE — ED Notes (Signed)
Patient ambulated from room to nurses station back to room. Patient on 3L of oxygen dropped to 86% while ambulating. MD made aware.

## 2014-05-18 NOTE — ED Notes (Signed)
PT c/o worsening in SOB x1 week and increased edema to lower extremities.

## 2014-05-18 NOTE — ED Provider Notes (Signed)
This chart was scribed for Troy MawKristen N Rogena Deupree, DO by Ronney LionSuzanne Le, ED Scribe. This patient was seen in room APA12/APA12.   TIME SEEN: 3:02 PM  CHIEF COMPLAINT: SOB  HPI: Marchia MeiersRobert R Marksberry is a 59 y.o. male with a history of COPD who presents to the Emergency Department complaining of shortness of breath that has been chronic for 1 year but has acutely worsened in the last 3-4 days. He also notes accompanying shooting chest pain which is normal with COPD exacerbations. He complains of associated cough productive with brown-green sputum that is also normal. Patient usually wears 2 L/min O2 at home.  Walking exacerbates his SOB. Nothing else makes it better or worse. Patient reports "it's been a while" since he has last taken steroids. He states he smokes occasionally. He denies a history of DVT/PE, CAD, or CHF. He denies nausea, vomiting, diarrhea, or fever. Dr[place]CP.    ROS: See HPI Constitutional: no fever  Eyes: no drainage  ENT: no runny nose   Cardiovascular:  chest pain  Resp: SOB  GI: no vomiting GU: no dysuria Integumentary: no rash  Allergy: no hives  Musculoskeletal: no leg swelling  Neurological: no slurred speech ROS otherwise negative  PAST MEDICAL HISTORY/PAST SURGICAL HISTORY:  Past Medical History  Diagnosis Date  . COPD (chronic obstructive pulmonary disease)   . Asthma   . Hypertension   . Sleep apnea     MEDICATIONS:  Prior to Admission medications   Medication Sig Start Date End Date Taking? Authorizing Provider  albuterol (PROVENTIL) (2.5 MG/3ML) 0.083% nebulizer solution Take 3 mLs (2.5 mg total) by nebulization every 2 (two) hours as needed for wheezing. 01/02/14   Nita SellsMaryann Mikhail, DO  dextromethorphan-guaiFENesin (MUCINEX DM) 30-600 MG per 12 hr tablet Take 1 tablet by mouth 2 (two) times daily. 01/02/14   Maryann Mikhail, DO  ipratropium-albuterol (DUONEB) 0.5-2.5 (3) MG/3ML SOLN Take 3 mLs by nebulization every 6 (six) hours. 01/02/14    Maryann Mikhail, DO  levofloxacin (LEVAQUIN) 500 MG tablet Take 1 tablet (500 mg total) by mouth daily. 01/02/14   Maryann Mikhail, DO  predniSONE (STERAPRED UNI-PAK) 10 MG tablet Take by mouth daily. Prednisone dosing: Take  Prednisone 40mg  (4 tabs) x 3 days, then taper to 30mg  (3 tabs) x 3 days, then 20mg  (2 tabs) x 3days, then 10mg  (1 tab) x 3days, then OFF.  Dispense:  30 tabs, refills: None 01/02/14   Maryann Mikhail, DO  tiotropium (SPIRIVA) 18 MCG inhalation capsule Place 18 mcg into inhaler and inhale daily.    Historical Provider, MD    ALLERGIES:  Allergies  Allergen Reactions  . Sudafed [Pseudoephedrine Hcl] Rash    SOCIAL HISTORY:  History  Substance Use Topics  . Smoking status: Current Every Day Smoker -- 1.00 packs/day    Types: Cigarettes  . Smokeless tobacco: Not on file  . Alcohol Use: No    FAMILY HISTORY: History reviewed. No pertinent family history.  EXAM: BP 155/95 mmHg  Pulse 97  Temp(Src) 97.6 F (36.4 C) (Oral)  Resp 30  Ht 5\' 8"  (1.727 m)  Wt 190 lb (86.183 kg)  BMI 28.90 kg/m2  SpO2 92% CONSTITUTIONAL: Alert and oriented and responds appropriately to questions. Well-appearing; well-nourished HEAD: Normocephalic EYES: Conjunctivae clear, PERRL ENT: normal nose; no rhinorrhea; moist mucous membranes; pharynx without lesions noted NECK: Supple, no meningismus, no LAD  CARD: Regular rhythm and tachycardic; S1 and S2 appreciated; no murmurs, no clicks, no rubs, no gallops  RESP: Equal breath sounds bilaterally, but diminished at the bases; diffuse expiratory wheezing; no rhonchi or rales; moderate respiratory distress; tachypeneic; speaking short sentences ABD/GI: Normal bowel sounds; non-distended; soft, non-tender, no rebound, no guarding BACK:  The back appears normal and is non-tender to palpation, there is no CVA tenderness EXT: Normal ROM in all joints; non-tender to palpation; no edema; normal capillary refill; no cyanosis; no calf swelling or  tenderness SKIN: Normal color for age and race; warm NEURO: Moves all extremities equally PSYCH: The patient's mood and manner are appropriate. Grooming and personal hygiene are appropriate.  MEDICAL DECISION MAKING: Patient here with moderate respiratory distress with COPD exacerbation. We'll give continuous albuterol, Solu-Medrol. We'll obtain ABG. Chest x-ray shows no edema or consolidation. Labs pending. EKG shows sinus tachycardia but no other ischemic changes and is unchanged compared to prior EKGs.  ED PROGRESS: 4:10 PM  Labs unremarkable. Troponin negative. Chest x-ray clear. Reports some improvement with continuous albuterol but he still has diffuse expiratory wheezing and increased work of breathing. We'll continue to closely monitor.  ABG looks well compensated, patient's baseline.   5:00 PM  Cont albuterol completed. He has improved aeration and decreased wheezing. Still mildly tachypneic but reports feeling better. Work of breathing has improved. Will obtain pulse ox with ambulation while on his chronic 2 L of oxygen.   5:45 PM  Pt's sats dropped into the mid 80s with ambulating just a short distance and he began very tachypneic, increased work of breathing, speaking only several words at a time. We'll give another DuoNeb treatment. Have added to increase his oxygen. He still has diffuse expiratory wheezing. We'll discuss with hospitalist for admission for COPD exacerbation.   6:10 PM  D/w Dr. Karilyn Cota for admission to medical bed, inpt.   EKG Interpretation  Date/Time:  Thursday May 18 2014 14:45:57 EST Ventricular Rate:  100 PR Interval:  136 QRS Duration: 84 QT Interval:  338 QTC Calculation: 436 R Axis:   60 Text Interpretation:  Sinus tachycardia Right atrial enlargement No significant change since last tracing Confirmed by Owais Pruett,  DO, Aviya Jarvie ([id number]) on 05/18/2014 2:53:15 PM         CRITICAL CARE Performed by: Raelyn Number   Total critical care time: 45  minutes  Critical care time was exclusive of separately billable procedures and treating other patients.  Critical care was necessary to treat or prevent imminent or life-threatening deterioration.  Critical care was time spent personally by me on the following activities: development of treatment plan with patient and/or surrogate as well as nursing, discussions with consultants, evaluation of patient's response to treatment, examination of patient, obtaining history from patient or surrogate, ordering and performing treatments and interventions, ordering and review of laboratory studies, ordering and review of radiographic studies, pulse oximetry and re-evaluation of patient's condition.      I personally performed the services described in this documentation, which was scribed in my presence. The recorded information has been reviewed and is accurate.   Troy Maw Johnae Friley, DO 05/18/14 [phone]

## 2014-05-18 NOTE — H&P (Signed)
[facility] History and Physical  Troy MeiersRobert R Mcnaught WUJ:[id number] DOB: 1956-03-22 DOA: 05/18/2014  Referring physician: ER PCP: No primary care provider on file.   Chief Complaint: Dyspnea, cough.  HPI: Troy Bates is a 59 y.o. male  This is a 59 year old man who has a history of COPD and continues to smoke half a pack of cigarettes a day now presents with 3-4 day history of increasing shortness of breath associated with a cough productive of green/brown sputum. He is usually oxygen dependent and has 2 L/m oxygen at home. He denies any chest pain, palpitations, fever, nausea or vomiting. Treatment in the emergency room has failed to improve him to a level that he feels he can go home. He is now being admitted for treatment of his exacerbation of COPD.   Review of Systems:  Apart from symptoms above, all systems negative.  Past Medical History  Diagnosis Date  . COPD (chronic obstructive pulmonary disease)   . Asthma   . Hypertension   . Sleep apnea    Past Surgical History  Procedure Laterality Date  . Back surgery    . Hernia repair    . Foot surgery     Social History:  reports that he has been smoking Cigarettes.  He has been smoking about 1.00 pack per day. He does not have any smokeless tobacco history on file. He reports that he does not drink alcohol or use illicit drugs.  Allergies  Allergen Reactions  . Sudafed [Pseudoephedrine Hcl] Rash     Family history: No history of lung disease in the family.  Prior to Admission medications   Medication Sig Start Date End Date Taking? Authorizing Provider  albuterol (PROVENTIL HFA;VENTOLIN HFA) 108 (90 BASE) MCG/ACT inhaler Inhale 2 puffs into the lungs every 6 (six) hours as needed for wheezing or shortness of breath.   Yes Historical Provider, MD  albuterol (PROVENTIL) (2.5 MG/3ML) 0.083% nebulizer solution Take 3 mLs (2.5 mg total) by nebulization every 2 (two) hours as needed for wheezing. 01/02/14  Yes  Maryann Mikhail, DO  ipratropium-albuterol (DUONEB) 0.5-2.5 (3) MG/3ML SOLN Take 3 mLs by nebulization every 6 (six) hours. 01/02/14  Yes Maryann Mikhail, DO  dextromethorphan-guaiFENesin (MUCINEX DM) 30-600 MG per 12 hr tablet Take 1 tablet by mouth 2 (two) times daily. Patient not taking: Reported on 05/18/2014 01/02/14   Nita SellsMaryann Mikhail, DO  levofloxacin (LEVAQUIN) 500 MG tablet Take 1 tablet (500 mg total) by mouth daily. Patient not taking: Reported on 05/18/2014 01/02/14   Nita SellsMaryann Mikhail, DO  predniSONE (STERAPRED UNI-PAK) 10 MG tablet Take by mouth daily. Prednisone dosing: Take  Prednisone 40mg  (4 tabs) x 3 days, then taper to 30mg  (3 tabs) x 3 days, then 20mg  (2 tabs) x 3days, then 10mg  (1 tab) x 3days, then OFF.  Dispense:  30 tabs, refills: None Patient not taking: Reported on 05/18/2014 01/02/14   Edsel PetrinMaryann Mikhail, DO   Physical Exam: Filed Vitals:   05/18/14 1700 05/18/14 1730 05/18/14 1751 05/18/14 1800  BP: 135/76 152/90  144/94  Pulse: 107 112  107  Temp:      TempSrc:      Resp: 26 23  18   Height:      Weight:      SpO2: 94% 90% 94% 92%    Wt Readings from Last 3 Encounters:  05/18/14 86.183 kg (190 lb)  01/01/14 82.645 kg (182 lb 3.2 oz)    General:  Appears calm and comfortable. There does not appear to  be increased work of breathing at rest. There is no peripheral or central cyanosis. There is no clubbing. Eyes: PERRL, normal lids, irises & conjunctiva ENT: grossly normal hearing, lips & tongue Neck: no LAD, masses or thyromegaly Cardiovascular: RRR, no m/r/g. No LE edema. Telemetry: SR, no arrhythmias  Respiratory: Reduced air entry bilaterally with scattered wheezing. There are no crackles or bronchial breathing. Abdomen: soft, ntnd Skin: no rash or induration seen on limited exam Musculoskeletal: grossly normal tone BUE/BLE Psychiatric: grossly normal mood and affect, speech fluent and appropriate Neurologic: grossly non-focal.          Labs on Admission:    Basic Metabolic Panel:  Recent Labs Lab 05/18/14 1447  NA 135  K 3.8  CL 99  CO2 30  GLUCOSE 106*  BUN 7  CREATININE 0.98  CALCIUM 8.8   Liver Function Tests:  Recent Labs Lab 05/18/14 1447  AST 13  ALT 14  ALKPHOS 67  BILITOT 0.8  PROT 6.5  ALBUMIN 3.5   No results for input(s): LIPASE, AMYLASE in the last 168 hours. No results for input(s): AMMONIA in the last 168 hours. CBC:  Recent Labs Lab 05/18/14 1447  WBC 10.0  NEUTROABS 5.7  HGB 16.2  HCT 47.2  MCV 90.2  PLT 224   Cardiac Enzymes:  Recent Labs Lab 05/18/14 1447  TROPONINI <0.03    BNP (last 3 results)  Recent Labs  05/18/14 1447  BNP 33.0    ProBNP (last 3 results) No results for input(s): PROBNP in the last 8760 hours.  CBG: No results for input(s): GLUCAP in the last 168 hours.  Radiological Exams on Admission: Dg Chest Portable 1 View  05/18/2014   CLINICAL DATA:  Chest pain and difficulty breathing  EXAM: PORTABLE CHEST - 1 VIEW  COMPARISON:  December 31, 2013  FINDINGS: There is no edema or consolidation. Heart size and pulmonary vascularity are within normal limits. There is atherosclerotic change in the aortic arch region. No adenopathy. No pneumothorax. No bone lesions.  IMPRESSION: No edema or consolidation.   Electronically Signed   By: Bretta Bang III M.D.   On: 05/18/2014 15:16      Assessment/Plan Active Problems:   COPD exacerbation   1. COPD exacerbation. He will be treated with a combination of bronchodilators, intravenous steroids and antibiotics. 2. Ongoing tobacco abuse. I have encouraged him to quit smoking cigarettes.  Further recommendations will depend on patient's hospital progress.   Code Status: Full code.   DVT Prophylaxis: Heparin.  Family Communication: I discussed the plan with the patient at the bedside.   Disposition Plan: Home when medically stable.   Time spent: 45 minutes.  Wilson Singer [facility] Pager  [phone].

## 2014-05-19 LAB — COMPREHENSIVE METABOLIC PANEL
ALK PHOS: 67 U/L (ref 39–117)
ALT: 14 U/L (ref 0–53)
AST: 17 U/L (ref 0–37)
Albumin: 3.5 g/dL (ref 3.5–5.2)
Anion gap: 9 (ref 5–15)
BUN: 11 mg/dL (ref 6–23)
CO2: 30 mmol/L (ref 19–32)
Calcium: 9.3 mg/dL (ref 8.4–10.5)
Chloride: 99 mmol/L (ref 96–112)
Creatinine, Ser: 0.86 mg/dL (ref 0.50–1.35)
GFR calc Af Amer: >90 mL/min (ref >90)
GFR calc non Af Amer: >90 mL/min (ref >90)
GLUCOSE: 122 mg/dL — AB (ref 70–99)
POTASSIUM: 4.3 mmol/L (ref 3.5–5.1)
Sodium: 138 mmol/L (ref 135–145)
Total Bilirubin: 0.6 mg/dL (ref 0.3–1.2)
Total Protein: 6.5 g/dL (ref 6.0–8.3)

## 2014-05-19 LAB — CBC
HCT: 49.4 % (ref 39.0–52.0)
Hemoglobin: 16.9 g/dL (ref 13.0–17.0)
MCH: 30.1 pg (ref 26.0–34.0)
MCHC: 34.2 g/dL (ref 30.0–36.0)
MCV: 87.9 fL (ref 78.0–100.0)
Platelets: 245 10*3/uL (ref 150–400)
RBC: 5.62 MIL/uL (ref 4.22–5.81)
RDW: 13.8 % (ref 11.5–15.5)
WBC: 10.7 10*3/uL — ABNORMAL HIGH (ref 4.0–10.5)

## 2014-05-19 MED ORDER — ACETAMINOPHEN 325 MG PO TABS
650.0000 mg | ORAL_TABLET | Freq: Four times a day (QID) | ORAL | Status: DC | PRN
  Administered 2014-05-19: 650 mg via ORAL
  Filled 2014-05-19: qty 2

## 2014-05-19 MED ORDER — TIOTROPIUM BROMIDE MONOHYDRATE 18 MCG IN CAPS
18.0000 ug | ORAL_CAPSULE | Freq: Every day | RESPIRATORY_TRACT | Status: DC
  Filled 2014-05-19: qty 5

## 2014-05-19 MED ORDER — BUDESONIDE-FORMOTEROL FUMARATE 160-4.5 MCG/ACT IN AERO
2.0000 | INHALATION_SPRAY | Freq: Two times a day (BID) | RESPIRATORY_TRACT | Status: DC
  Administered 2014-05-19 – 2014-05-20 (×2): 2 via RESPIRATORY_TRACT
  Filled 2014-05-19: qty 6

## 2014-05-19 MED ORDER — METHYLPREDNISOLONE SODIUM SUCC 125 MG IJ SOLR
60.0000 mg | Freq: Two times a day (BID) | INTRAMUSCULAR | Status: DC
  Administered 2014-05-20: 60 mg via INTRAVENOUS
  Filled 2014-05-19: qty 2

## 2014-05-19 NOTE — Care Management Note (Signed)
    Page 1 of 1   05/19/2014     4:03:59 PM CARE MANAGEMENT NOTE 05/19/2014  Patient:  Troy Bates,Troy Bates   Account Number:  [id number]  Date Initiated:  05/19/2014  Documentation initiated by:  Troy Bates,Troy Bates  Subjective/Objective Assessment:   Admitted with COPD. Pt is from home, is independent, and will return home at D/C. [facility] He is on 2.5L at home and is on 5L at present here     Action/Plan:   No needs identified- pt is not home bound. [facility] in the past 3-4 months, so does not qualify for that. Financial councelor aware of pt   Anticipated DC Date:  05/21/2014   Anticipated DC Plan:  HOME/SELF CARE      DC Planning Services  CM consult      Choice offered to / List presented to:             Status of service:  Completed, signed off Medicare Important Message given?   (If response is "NO", the following Medicare IM given date fields will be blank) Date Medicare IM given:   Medicare IM given by:   Date Additional Medicare IM given:   Additional Medicare IM given by:    Discharge Disposition:  HOME/SELF CARE  Per UR Regulation:  Reviewed for med. necessity/level of care/duration of stay  If discussed at Long Length of Stay Meetings, dates discussed:    Comments:  05/19/14  1500 Troy HendersonGeneva Kiri Hinderliter RN/CM

## 2014-05-19 NOTE — Care Management (Signed)
UR completed 

## 2014-05-19 NOTE — Progress Notes (Addendum)
[facility] PROGRESS NOTE  AVARY PITSENBARGER ZOX:[id number] DOB: 1955/09/22 DOA: 05/18/2014 PCP: No primary care provider on file.  Assessment/Plan: Acute on Chronic Respiratory failure -2/2 COPD. -See below for details.  COPD with Acute Exacerbation -Not optimally medicated at home. -Has financial issues, will ask CM to see for medication assistance -Add symbicort/spiriva and PRN albuterol MDI. -Improved today; will start steroid taper  Tobacco Abuse -Not interested in cessation.  Code Status: Full Code Family Communication: Patient only  Disposition Plan: Home when medically ready, 1-2 days   Consultants:  None   Antibiotics:  None   Subjective: SOB improved.  Objective: Filed Vitals:   05/19/14 0242 05/19/14 0458 05/19/14 0729 05/19/14 1536  BP:  137/78  138/75  Pulse:  94  105  Temp:  98 F (36.7 C)    TempSrc:  Oral    Resp:  19  20  Height:      Weight:      SpO2: 96% 92% 89% 93%    Intake/Output Summary (Last 24 hours) at 05/19/14 1606 Last data filed at 05/19/14 0600  Gross per 24 hour  Intake    360 ml  Output      0 ml  Net    360 ml   Filed Weights   05/18/14 1430  Weight: 86.183 kg (190 lb)    Exam:   General:  AA Ox3  Cardiovascular: RRR  Respiratory: Minimal wheezing  Abdomen: S/NT/ND/+BS  Extremities: no C/C/E   Neurologic:  Intact/non-focal  Data Reviewed: Basic Metabolic Panel:  Recent Labs Lab 05/18/14 1447 05/19/14 0612  NA 135 138  K 3.8 4.3  CL 99 99  CO2 30 30  GLUCOSE 106* 122*  BUN 7 11  CREATININE 0.98 0.86  CALCIUM 8.8 9.3   Liver Function Tests:  Recent Labs Lab 05/18/14 1447 05/19/14 0612  AST 13 17  ALT 14 14  ALKPHOS 67 67  BILITOT 0.8 0.6  PROT 6.5 6.5  ALBUMIN 3.5 3.5   No results for input(s): LIPASE, AMYLASE in the last 168 hours. No results for input(s): AMMONIA in the last 168 hours. CBC:  Recent Labs Lab 05/18/14 1447 05/19/14 0612  WBC 10.0 10.7*    NEUTROABS 5.7  --   HGB 16.2 16.9  HCT 47.2 49.4  MCV 90.2 87.9  PLT 224 245   Cardiac Enzymes:  Recent Labs Lab 05/18/14 1447  TROPONINI <0.03   BNP (last 3 results)  Recent Labs  05/18/14 1447  BNP 33.0    ProBNP (last 3 results) No results for input(s): PROBNP in the last 8760 hours.  CBG: No results for input(s): GLUCAP in the last 168 hours.  No results found for this or any previous visit (from the past 240 hour(s)).   Studies: Dg Chest Portable 1 View  05/18/2014   CLINICAL DATA:  Chest pain and difficulty breathing  EXAM: PORTABLE CHEST - 1 VIEW  COMPARISON:  December 31, 2013  FINDINGS: There is no edema or consolidation. Heart size and pulmonary vascularity are within normal limits. There is atherosclerotic change in the aortic arch region. No adenopathy. No pneumothorax. No bone lesions.  IMPRESSION: No edema or consolidation.   Electronically Signed   By: Bretta Bang III M.D.   On: 05/18/2014 15:16    Scheduled Meds: . budesonide-formoterol  2 puff Inhalation BID  . heparin  5,000 Units Subcutaneous 3 times per day  . ipratropium-albuterol  3 mL Nebulization Q6H  . levofloxacin (LEVAQUIN)  IV  500 mg Intravenous Q24H  . [START ON 05/20/2014] methylPREDNISolone (SOLU-MEDROL) injection  60 mg Intravenous Q12H  . tiotropium  18 mcg Inhalation Daily   Continuous Infusions:   Active Problems:   COPD exacerbation    Time spent: 35 minutes. Greater than 50% of this time was spent in direct contact with the patient coordinating care.    Chaya JanHERNANDEZ ACOSTA,Baily Hovanec  [facility] Pager [phone]  If 7PM-7AM, please contact night-coverage at [email or web address], password [email or web address] 05/19/2014, 4:06 PM  LOS: 1 day

## 2014-05-20 DIAGNOSIS — Z720 Tobacco use: Secondary | ICD-10-CM

## 2014-05-20 DIAGNOSIS — F17210 Nicotine dependence, cigarettes, uncomplicated: Secondary | ICD-10-CM

## 2014-05-20 DIAGNOSIS — J9621 Acute and chronic respiratory failure with hypoxia: Secondary | ICD-10-CM

## 2014-05-20 DIAGNOSIS — J9620 Acute and chronic respiratory failure, unspecified whether with hypoxia or hypercapnia: Secondary | ICD-10-CM

## 2014-05-20 MED ORDER — BUDESONIDE-FORMOTEROL FUMARATE 160-4.5 MCG/ACT IN AERO: 2.0000 | INHALATION_SPRAY | Freq: Two times a day (BID) | RESPIRATORY_TRACT | Status: AC

## 2014-05-20 MED ORDER — TIOTROPIUM BROMIDE MONOHYDRATE 18 MCG IN CAPS: 18.0000 ug | ORAL_CAPSULE | Freq: Every day | RESPIRATORY_TRACT | Status: DC

## 2014-05-20 MED ORDER — PREDNISONE 10 MG PO TABS: 10.0000 mg | ORAL_TABLET | Freq: Every day | ORAL | Status: DC

## 2014-05-20 NOTE — Discharge Summary (Signed)
Physician Discharge Summary  Troy MeiersRobert R Bates ZOX:[id number] DOB: 10-02-1955 DOA: 05/18/2014  PCP: No primary care provider on file.  Admit date: 05/18/2014 Discharge date: 05/20/2014  Time spent: 45 minutes  Recommendations for Outpatient Follow-up:  -Will be discharged home today. -Advised to follow up with PCP in 2 weeks.   Discharge Diagnoses:  Active Problems:   COPD exacerbation   Acute on chronic respiratory failure   Tobacco abuse   Discharge Condition: Stable and improved  Filed Weights   05/18/14 1430  Weight: 86.183 kg (190 lb)    History of present illness:  This is a 59 year old man who has a history of COPD and continues to smoke half a pack of cigarettes a day now presents with 3-4 day history of increasing shortness of breath associated with a cough productive of green/brown sputum. He is usually oxygen dependent and has 2 L/m oxygen at home. He denies any chest pain, palpitations, fever, nausea or vomiting. Treatment in the emergency room has failed to improve him to a level that he feels he can go home. He is now being admitted for treatment of his exacerbation of COPD.   Hospital Course:   Acute on Chronic Respiratory failure -2/2 COPD. -See below for details.  COPD with Acute Exacerbation -Not optimally medicated at home. -Add symbicort/spiriva and PRN albuterol MDI. -Continue steroid taper.  Tobacco Abuse -Not interested in cessation.  Procedures:  None   Consultations:  None  Discharge Instructions      Discharge Instructions    Increase activity slowly    Complete by:  As directed             Medication List    STOP taking these medications        ipratropium-albuterol 0.5-2.5 (3) MG/3ML Soln  Commonly known as:  DUONEB     levofloxacin 500 MG tablet  Commonly known as:  LEVAQUIN     predniSONE 10 MG tablet  Commonly known as:  STERAPRED UNI-PAK  Replaced by:  predniSONE 10 MG tablet      TAKE these medications         albuterol 108 (90 BASE) MCG/ACT inhaler  Commonly known as:  PROVENTIL HFA;VENTOLIN HFA  Inhale 2 puffs into the lungs every 6 (six) hours as needed for wheezing or shortness of breath.     albuterol (2.5 MG/3ML) 0.083% nebulizer solution  Commonly known as:  PROVENTIL  Take 3 mLs (2.5 mg total) by nebulization every 2 (two) hours as needed for wheezing.     budesonide-formoterol 160-4.5 MCG/ACT inhaler  Commonly known as:  SYMBICORT  Inhale 2 puffs into the lungs 2 (two) times daily.     dextromethorphan-guaiFENesin 30-600 MG per 12 hr tablet  Commonly known as:  MUCINEX DM  Take 1 tablet by mouth 2 (two) times daily.     predniSONE 10 MG tablet  Commonly known as:  DELTASONE  Take 1 tablet (10 mg total) by mouth daily with breakfast. Take 6 tablets today and then decrease by 1 tablet daily until none are left.     tiotropium 18 MCG inhalation capsule  Commonly known as:  SPIRIVA  Place 1 capsule (18 mcg total) into inhaler and inhale daily.       Allergies  Allergen Reactions  . Sudafed [Pseudoephedrine Hcl] Rash   Follow-up Information    Schedule an appointment as soon as possible for a visit in 2 weeks to follow up.   Why:  With your primary  provider       The results of significant diagnostics from this hospitalization (including imaging, microbiology, ancillary and laboratory) are listed below for reference.    Significant Diagnostic Studies: Dg Chest Portable 1 View  05/18/2014   CLINICAL DATA:  Chest pain and difficulty breathing  EXAM: PORTABLE CHEST - 1 VIEW  COMPARISON:  December 31, 2013  FINDINGS: There is no edema or consolidation. Heart size and pulmonary vascularity are within normal limits. There is atherosclerotic change in the aortic arch region. No adenopathy. No pneumothorax. No bone lesions.  IMPRESSION: No edema or consolidation.   Electronically Signed   By: Bretta Bang III M.D.   On: 05/18/2014 15:16    Microbiology: No results  found for this or any previous visit (from the past 240 hour(s)).   Labs: Basic Metabolic Panel:  Recent Labs Lab 05/18/14 1447 05/19/14 0612  NA 135 138  K 3.8 4.3  CL 99 99  CO2 30 30  GLUCOSE 106* 122*  BUN 7 11  CREATININE 0.98 0.86  CALCIUM 8.8 9.3   Liver Function Tests:  Recent Labs Lab 05/18/14 1447 05/19/14 0612  AST 13 17  ALT 14 14  ALKPHOS 67 67  BILITOT 0.8 0.6  PROT 6.5 6.5  ALBUMIN 3.5 3.5   No results for input(s): LIPASE, AMYLASE in the last 168 hours. No results for input(s): AMMONIA in the last 168 hours. CBC:  Recent Labs Lab 05/18/14 1447 05/19/14 0612  WBC 10.0 10.7*  NEUTROABS 5.7  --   HGB 16.2 16.9  HCT 47.2 49.4  MCV 90.2 87.9  PLT 224 245   Cardiac Enzymes:  Recent Labs Lab 05/18/14 1447  TROPONINI <0.03   BNP: BNP (last 3 results)  Recent Labs  05/18/14 1447  BNP 33.0    ProBNP (last 3 results) No results for input(s): PROBNP in the last 8760 hours.  CBG: No results for input(s): GLUCAP in the last 168 hours.     SignedChaya Jan  [facility] Pager: [phone] 05/20/2014, 2:07 PM

## 2014-05-20 NOTE — Progress Notes (Signed)
Patient d/c home this shift. D/C instructions and hard Rx given to patient prior to d/c from facility. Patient confirmed understanding of the need to f/u with MD after d/c from hospital today. Inhalers patient used while in the hospital given to patient to take home. Patient educated smoking cessation and ways to prevent COPD exacerbations. IV catheter removed from RIGHT forearm, catheter tip intact, no s/s of infection noted. Patient assisted to vehicle via w/c by this Clinical research associatewriter.

## 2014-05-28 ENCOUNTER — Emergency Department (HOSPITAL_COMMUNITY)
Admission: EM | Admit: 2014-05-28 | Discharge: 2014-05-28 | Disposition: A | Discharge status: Home / Self Care | Payer: Self-pay | Attending: Emergency Medicine | Admitting: Emergency Medicine

## 2014-05-28 ENCOUNTER — Encounter (HOSPITAL_COMMUNITY): Payer: Self-pay

## 2014-05-28 ENCOUNTER — Emergency Department (HOSPITAL_COMMUNITY): Payer: Self-pay

## 2014-05-28 DIAGNOSIS — S2242XA Multiple fractures of ribs, left side, initial encounter for closed fracture: Secondary | ICD-10-CM | POA: Insufficient documentation

## 2014-05-28 DIAGNOSIS — Z79899 Other long term (current) drug therapy: Secondary | ICD-10-CM | POA: Insufficient documentation

## 2014-05-28 DIAGNOSIS — I10 Essential (primary) hypertension: Secondary | ICD-10-CM | POA: Insufficient documentation

## 2014-05-28 DIAGNOSIS — Y9289 Other specified places as the place of occurrence of the external cause: Secondary | ICD-10-CM | POA: Insufficient documentation

## 2014-05-28 DIAGNOSIS — J449 Chronic obstructive pulmonary disease, unspecified: Secondary | ICD-10-CM

## 2014-05-28 DIAGNOSIS — Y9301 Activity, walking, marching and hiking: Secondary | ICD-10-CM | POA: Insufficient documentation

## 2014-05-28 DIAGNOSIS — S3991XA Unspecified injury of abdomen, initial encounter: Secondary | ICD-10-CM | POA: Insufficient documentation

## 2014-05-28 DIAGNOSIS — S2232XA Fracture of one rib, left side, initial encounter for closed fracture: Secondary | ICD-10-CM

## 2014-05-28 DIAGNOSIS — R000 Tachycardia, unspecified: Secondary | ICD-10-CM | POA: Insufficient documentation

## 2014-05-28 DIAGNOSIS — Z7951 Long term (current) use of inhaled steroids: Secondary | ICD-10-CM | POA: Insufficient documentation

## 2014-05-28 DIAGNOSIS — J441 Chronic obstructive pulmonary disease with (acute) exacerbation: Secondary | ICD-10-CM | POA: Insufficient documentation

## 2014-05-28 DIAGNOSIS — Z7952 Long term (current) use of systemic steroids: Secondary | ICD-10-CM | POA: Insufficient documentation

## 2014-05-28 DIAGNOSIS — Y998 Other external cause status: Secondary | ICD-10-CM | POA: Insufficient documentation

## 2014-05-28 DIAGNOSIS — Z8669 Personal history of other diseases of the nervous system and sense organs: Secondary | ICD-10-CM | POA: Insufficient documentation

## 2014-05-28 DIAGNOSIS — W541XXA Struck by dog, initial encounter: Secondary | ICD-10-CM | POA: Insufficient documentation

## 2014-05-28 MED ORDER — KETOROLAC TROMETHAMINE 60 MG/2ML IM SOLN
60.0000 mg | Freq: Once | INTRAMUSCULAR | Status: AC
  Administered 2014-05-28: 60 mg via INTRAMUSCULAR
  Filled 2014-05-28: qty 2

## 2014-05-28 MED ORDER — FENTANYL CITRATE 0.05 MG/ML IJ SOLN
100.0000 ug | Freq: Once | INTRAMUSCULAR | Status: AC
  Administered 2014-05-28: 100 ug via INTRAMUSCULAR
  Filled 2014-05-28: qty 2

## 2014-05-28 MED ORDER — HYDROCODONE-ACETAMINOPHEN 7.5-325 MG PO TABS: 1.0000 | ORAL_TABLET | ORAL | Status: DC | PRN

## 2014-05-28 MED ORDER — METHOCARBAMOL 500 MG PO TABS
1000.0000 mg | ORAL_TABLET | Freq: Once | ORAL | Status: AC
  Administered 2014-05-28: 1000 mg via ORAL
  Filled 2014-05-28: qty 2

## 2014-05-28 MED ORDER — PREDNISONE 50 MG PO TABS
60.0000 mg | ORAL_TABLET | Freq: Once | ORAL | Status: AC
  Administered 2014-05-28: 60 mg via ORAL
  Filled 2014-05-28 (×2): qty 1

## 2014-05-28 MED ORDER — METHOCARBAMOL 500 MG PO TABS: 500.0000 mg | ORAL_TABLET | Freq: Three times a day (TID) | ORAL | Status: DC

## 2014-05-28 MED ORDER — ONDANSETRON 4 MG PO TBDP
4.0000 mg | ORAL_TABLET | Freq: Once | ORAL | Status: AC
  Administered 2014-05-28: 4 mg via ORAL
  Filled 2014-05-28: qty 1

## 2014-05-28 NOTE — ED Notes (Signed)
Hobson PA at bedside,  

## 2014-05-28 NOTE — ED Notes (Signed)
Patient with no complaints at this time. Respirations even and unlabored. Skin warm/dry. Discharge instructions reviewed with patient at this time. Patient given opportunity to voice concerns/ask questions. Patient discharged at this time and left Emergency Department with steady gait.   

## 2014-05-28 NOTE — Discharge Instructions (Signed)
You have 2 broken ribs on the left side. It is important that you use your incentive spirometer every 2 hours while awake. Use your oxygen as prescribed. Continue your current medications. Please add Robaxin 3 times daily, may use Norco every 4 hours if needed for pain. It is very important that she follow up with your primary physician this week. Rib Fracture A rib fracture is a break or crack in one of the bones of the ribs. The ribs are a group of long, curved bones that wrap around your chest and attach to your spine. They protect your lungs and other organs in the chest cavity. A broken or cracked rib is often painful, but most do not cause other problems. Most rib fractures heal on their own over time. However, rib fractures can be more serious if multiple ribs are broken or if broken ribs move out of place and push against other structures. CAUSES   A direct blow to the chest. For example, this could happen during contact sports, a car accident, or a fall against a hard object.  Repetitive movements with high force, such as pitching a baseball or having severe coughing spells. SYMPTOMS   Pain when you breathe in or cough.  Pain when someone presses on the injured area. DIAGNOSIS  Your caregiver will perform a physical exam. Various imaging tests may be ordered to confirm the diagnosis and to look for related injuries. These tests may include a chest X-ray, computed tomography (CT), magnetic resonance imaging (MRI), or a bone scan. TREATMENT  Rib fractures usually heal on their own in 1-3 months. The longer healing period is often associated with a continued cough or other aggravating activities. During the healing period, pain control is very important. Medication is usually given to control pain. Hospitalization or surgery may be needed for more severe injuries, such as those in which multiple ribs are broken or the ribs have moved out of place.  HOME CARE INSTRUCTIONS   Avoid strenuous  activity and any activities or movements that cause pain. Be careful during activities and avoid bumping the injured rib.  Gradually increase activity as directed by your caregiver.  Only take over-the-counter or prescription medications as directed by your caregiver. Do not take other medications without asking your caregiver first.  Apply ice to the injured area for the first 1-2 days after you have been treated or as directed by your caregiver. Applying ice helps to reduce inflammation and pain.  Put ice in a plastic bag.  Place a towel between your skin and the bag.   Leave the ice on for 15-20 minutes at a time, every 2 hours while you are awake.  Perform deep breathing as directed by your caregiver. This will help prevent pneumonia, which is a common complication of a broken rib. Your caregiver may instruct you to:  Take deep breaths several times a day.  Try to cough several times a day, holding a pillow against the injured area.  Use a device called an incentive spirometer to practice deep breathing several times a day.  Drink enough fluids to keep your urine clear or pale yellow. This will help you avoid constipation.   Do not wear a rib belt or binder. These restrict breathing, which can lead to pneumonia.  SEEK IMMEDIATE MEDICAL CARE IF:   You have a fever.   You have difficulty breathing or shortness of breath.   You develop a continual cough, or you cough up thick or bloody  sputum.  You feel sick to your stomach (nausea), throw up (vomit), or have abdominal pain.   You have worsening pain not controlled with medications.  MAKE SURE YOU:  Understand these instructions.  Will watch your condition.  Will get help right away if you are not doing well or get worse. Document Released: 03/24/2005 Document Revised: 11/24/2012 Document Reviewed: 05/26/2012 [facility] Patient Information 2015 [place], [place]. This information is not intended to replace advice given  to you by your health care provider. Make sure you discuss any questions you have with your health care provider.

## 2014-05-28 NOTE — ED Notes (Signed)
Pt states that he tripped over a dog chain this past Thursday falling, c/o pain to left rib cage area, pain is worse with inspiration, movement, palpation of that area. Pt states that he has not been able to rest, sleep since falling due to the pain, has been sleeping sitting up, has swelling to bilateral lower extremities that has become worse since the fall, pt reports that he feels that the swelling is due to sleeping sitting up. Recent admission for copd.

## 2014-05-28 NOTE — ED Notes (Signed)
Upon arrival to tx room, pt sitting on side of bed, resp labored, swelling noted to bilateral lower extremities, difficulty with speaking in full sentences. Pt reports that the sob has been the same and that he needs to get "settled down"

## 2014-05-28 NOTE — ED Notes (Addendum)
Pt reports tripped over a dog and fell on left side Thursday.  Reports pain and swelling to left ribs.  Pt says has been unable to sleep due to pain.  Denies any worsening SOB.  Reports history of COPD and was recently here for copd exacerbation.  Pt also reports swelling in lower legs since Wednesday.  Pt has very congested cough but says is not new.

## 2014-05-28 NOTE — ED Provider Notes (Signed)
CSN: [id number]     Arrival date & time 05/28/14  1056 History  This chart was scribed for Troy Quale, PA-C, working with Raeford Razor, MD by Leona Carry, ED Scribe. The patient was seen in [facility]. The patient's care was started at 11:48 AM.     Chief Complaint  Patient presents with  . rib pain    Patient is a 59 y.o. male presenting with flank pain. The history is provided by the patient. No language interpreter was used.  Flank Pain This is a new problem. The current episode started more than 2 days ago. The problem occurs constantly. The problem has not changed since onset.Associated symptoms comments: Left rib pain.. The symptoms are aggravated by coughing (Inspiration).   HPI Comments: Troy Bates is a 59 y.o. male with a history of COPD and HTN who presents to the Emergency Department complaining of constant left-sided rib pain beginning three day ago. Patient states that the pain is exacerbated by coughing and deep inspiration. Patient reports that the pain is associated with a fall that occurred three days ago while walking his dog. He denies further trauma or LOC from the fall. He denies fever, hematuria, hematochezia, or hemoptysis. Patient uses 2.5 L/min of oxygen at home.  Patient does not have a PCP.    Past Medical History  Diagnosis Date  . COPD (chronic obstructive pulmonary disease)   . Asthma   . Hypertension   . Sleep apnea    Past Surgical History  Procedure Laterality Date  . Back surgery    . Hernia repair    . Foot surgery     No family history on file. History  Substance Use Topics  . Smoking status: Current Every Day Smoker -- 0.50 packs/day    Types: Cigarettes  . Smokeless tobacco: Not on file  . Alcohol Use: No    Review of Systems  Constitutional: Negative for fever.  Gastrointestinal: Negative for blood in stool.  Genitourinary: Positive for flank pain. Negative for hematuria.  Musculoskeletal:       Left rib pain.  All  other systems reviewed and are negative.     Allergies  Sudafed  Home Medications   Prior to Admission medications   Medication Sig Start Date End Date Taking? Authorizing Provider  albuterol (PROVENTIL HFA;VENTOLIN HFA) 108 (90 BASE) MCG/ACT inhaler Inhale 2 puffs into the lungs every 6 (six) hours as needed for wheezing or shortness of breath.    Historical Provider, MD  albuterol (PROVENTIL) (2.5 MG/3ML) 0.083% nebulizer solution Take 3 mLs (2.5 mg total) by nebulization every 2 (two) hours as needed for wheezing. 01/02/14   Nita Sells Mikhail, DO  budesonide-formoterol (SYMBICORT) 160-4.5 MCG/ACT inhaler Inhale 2 puffs into the lungs 2 (two) times daily. 05/20/14   Henderson Cloud, MD  dextromethorphan-guaiFENesin [facility] DM) 30-600 MG per 12 hr tablet Take 1 tablet by mouth 2 (two) times daily. Patient not taking: Reported on 05/18/2014 01/02/14   Nita Sells Mikhail, DO  predniSONE (DELTASONE) 10 MG tablet Take 1 tablet (10 mg total) by mouth daily with breakfast. Take 6 tablets today and then decrease by 1 tablet daily until none are left. 05/20/14   Henderson Cloud, MD  tiotropium (SPIRIVA) 18 MCG inhalation capsule Place 1 capsule (18 mcg total) into inhaler and inhale daily. 05/20/14   Henderson Cloud, MD   Triage Vitals: BP 146/88 mmHg  Pulse 97  Temp(Src) 98.6 F (37 C) (Oral)  Resp 28  Ht 5\' 8"  (1.727 m)  Wt 190 lb (86.183 kg)  BMI 28.90 kg/m2  SpO2 95% Physical Exam  Constitutional: He is oriented to person, place, and time. He appears well-developed and well-nourished. He appears distressed.  HENT:  Head: Normocephalic and atraumatic.  Eyes: Conjunctivae and EOM are normal.  Neck: Neck supple. No JVD present. No tracheal deviation present.  Cardiovascular: Tachycardia present.   Pulmonary/Chest: Accessory muscle usage present. Tachypnea noted. No respiratory distress. He has wheezes. He has rhonchi.  Mid to lower left flank tenderness at the rib  area. No visible bruising.  Abdominal: Soft. Bowel sounds are normal.  Musculoskeletal: Normal range of motion. He exhibits edema.  1+ edema of the lower extremities.  Neurological: He is alert and oriented to person, place, and time.  Skin: Skin is warm and dry.  Psychiatric: He has a normal mood and affect. His behavior is normal.  Nursing note and vitals reviewed.   ED Course  Procedures (including critical care time) DIAGNOSTIC STUDIES: Oxygen Saturation is 95% on room air, normal by my interpretation.    COORDINATION OF CARE: 11:53 AM-Discussed treatment plan which includes EKG and CXR with pt at bedside and pt agreed to plan.     Labs Review Labs Reviewed - No data to display  Imaging Review No results found.   EKG Interpretation None      MDM  Rib and chest xray suggest rib fractures on the left, #6 and7.  Pt treated with incentive spirometry,  Rx for Robaxin and norco given to the patient. Pt to f/u with PCP or return to the ED if any changes or problem.   Final diagnoses:  Rib fractures, left, closed, initial encounter  Chronic obstructive pulmonary disease, unspecified COPD, unspecified chronic bronchitis type    *I personally performed the services described in this documentation, which was scribed in my presence. The recorded information has been reviewed and is accurate.**  **I have reviewed nursing notes, vital signs, and all appropriate lab and imaging results for this patient.Kathie Dike*   Carrieanne Kleen M Adeja Sarratt, PA-C 06/03/14 1413  Raeford RazorStephen Kohut, MD 06/07/14 [phone]

## 2014-10-27 ENCOUNTER — Ambulatory Visit (INDEPENDENT_AMBULATORY_CARE_PROVIDER_SITE_OTHER): Payer: Medicaid Other | Admitting: Internal Medicine

## 2014-10-27 ENCOUNTER — Ambulatory Visit (INDEPENDENT_AMBULATORY_CARE_PROVIDER_SITE_OTHER)
Admission: RE | Admit: 2014-10-27 | Discharge: 2014-10-27 | Disposition: A | Discharge status: Home / Self Care | Payer: Medicaid Other | Source: Ambulatory Visit | Attending: Internal Medicine | Admitting: Internal Medicine

## 2014-10-27 ENCOUNTER — Encounter: Payer: Self-pay | Admitting: Internal Medicine

## 2014-10-27 VITALS — BP 158/90 | HR 104 | Ht 67.0 in | Wt 211.0 lb

## 2014-10-27 DIAGNOSIS — J449 Chronic obstructive pulmonary disease, unspecified: Secondary | ICD-10-CM | POA: Diagnosis not present

## 2014-10-27 DIAGNOSIS — F17210 Nicotine dependence, cigarettes, uncomplicated: Secondary | ICD-10-CM

## 2014-10-27 DIAGNOSIS — J852 Abscess of lung without pneumonia: Secondary | ICD-10-CM

## 2014-10-27 DIAGNOSIS — J9611 Chronic respiratory failure with hypoxia: Secondary | ICD-10-CM

## 2014-10-27 DIAGNOSIS — Z720 Tobacco use: Secondary | ICD-10-CM | POA: Diagnosis not present

## 2014-10-27 NOTE — Progress Notes (Signed)
Subjective:     Patient ID: Troy Bates, male   DOB: 27-Apr-1955,   MRN: [id number]  HPI  59 yowm obese smoker with new onset doe x 2014 placed on 02 after admit to [facility]:   Admit date: 05/18/2014 Discharge date: 05/20/2014  Time spent: 45 minutes  Recommendations for Outpatient Follow-up:  -Will be discharged home today. -Advised to follow up with PCP in 2 weeks.  Discharge Diagnoses:  Active Problems:  COPD exacerbation  Acute on chronic respiratory failure  Tobacco abuse   Discharge Condition: Stable and improved  Filed Weights   05/18/14 1430  Weight: 86.183 kg (190 lb)    History of present illness:  This is a 59 year old man who has a history of COPD and continues to smoke half a pack of cigarettes a day now presents with 3-4 day history of increasing shortness of breath associated with a cough productive of green/brown sputum. He is usually oxygen dependent and has 2 L/m oxygen at home. He denies any chest pain, palpitations, fever, nausea or vomiting. Treatment in the emergency room has failed to improve him to a level that he feels he can go home. He is now being admitted for treatment of his exacerbation of COPD.   Hospital Course:   Acute on Chronic Respiratory failure -2/2 COPD. -See below for details.  COPD with Acute Exacerbation -Not optimally medicated at home. -Add symbicort/spiriva and PRN albuterol MDI. -Continue steroid taper.      10/27/2014 1st [place] [facility] office visit/ Troy Bates  / 02 2lpm 24/7 but still smoking "chantix too expensive" Chief Complaint  Patient presents with  . Advice Only    COPD; RLL Lesion on CT Chest; pain in chest when cough or sneeze; SOB most of time; chest tightness; prod cough w/green mucus   at his best still wears 02 2lpm 24/7 and can do some shopping like at [facility] / uses [facility] parking / can't do [employer]  Worse than usual since 3-4 weeks prior to OV   With abrupt worsening cough > variably productive of  bloody mucus but usually green less so finishing up 9/10 days levaquin with ct suggestive of cavitary pna RLL 10/18/14   No pain on deep breathing  Already this am took symb/spiriva but very confused with use of saba in various forms   No obvious patterns in day to day or daytime variability or assoc  cp or chest tightness, subjective wheeze or overt sinus or hb symptoms. No unusual exp hx or h/o childhood pna/ asthma or knowledge of premature birth.  Sleeping ok without nocturnal  or early am exacerbation  of respiratory  c/o's or need for noct saba. Also denies any obvious fluctuation of symptoms with weather or environmental changes or other aggravating or alleviating factors except as outlined above   Current Medications, Allergies, Complete Past Medical History, Past Surgical History, Family History, and Social History were reviewed in [employer] record.  ROS  The following are not active complaints unless bolded sore throat, dysphagia, dental problems, itching, sneezing,  nasal congestion or excess/ purulent secretions, ear ache,   fever, chills, sweats, unintended wt loss, classically pleuritic or exertional cp, hemoptysis,  orthopnea pnd or leg swelling, presyncope, palpitations, abdominal pain from coughing?, anorexia, nausea, vomiting, diarrhea  or change in bowel or bladder habits, change in stools or urine, dysuria,hematuria,  rash, arthralgias, visual complaints, headache, numbness, weakness or ataxia or problems with walking or coordination,  change in mood/affect or memory.  Review of Systems     Objective:   Physical Exam    amb obese wm nad on 2lpm 24/7 - muscles very tight across chest   Wt Readings from Last 3 Encounters:  10/27/14 211 lb (95.709 kg)  05/28/14 190 lb (86.183 kg)  05/18/14 190 lb (86.183 kg)    Vital signs reviewed   HEENT: nl dentition, turbinates, and orophanx. Nl external ear canals without cough reflex   NECK  :  without JVD/Nodes/TM/ nl carotid upstrokes bilaterally   LUNGS: no acc muscle use, distant bs bilaterally with end exp wheeze    CV:  RRR  no s3 or murmur or increase in P2, no edema   ABD:  Tensely obese  nontender with nl excursion in the supine position. No bruits or organomegaly, bowel sounds nl  MS:  warm without deformities, calf tenderness, cyanosis or clubbing  SKIN: warm and dry without lesions    NEURO:  alert, approp, no deficits   CXR PA and Lateral:   10/27/2014 :     I personally reviewed images and agree with radiology impression as follows:   1. Faint density likely corresponding to the right lower lobe cavitary nodule on the recent chest CT, possibly improved compared to the prior radiographs. 2. COPD. No evidence of new abnormality in the chest.        Assessment:

## 2014-10-27 NOTE — Patient Instructions (Signed)
Please remember to go to the  x-ray department downstairs for your tests - we will call you with the results when they are available.  Plan A = automatic = symbicort /spiriva  Plan B = backup Only use your albuterol as a rescue medication to be used if you can't catch your breath by resting or doing a relaxed purse lip breathing pattern.  - The less you use it, the better it will work when you need it. - Ok to use up to 2 puffs  every 4 hours if you must but call for immediate appointment if use goes up over your usual need - Don't leave home without it !!  (think of it like the spare tire for your car)   Plan C = crisis  Only use albuterol  2.5 up to every 4 hours if you must  Please schedule a follow up office visit in 4 weeks, sooner if needed with pfts and all medications

## 2014-10-27 NOTE — Assessment & Plan Note (Signed)

## 2014-10-27 NOTE — Assessment & Plan Note (Signed)
DDX of  difficult airways management all start with A and  include Adherence, Ace Inhibitors, Acid Reflux, Active Sinus Disease, Alpha 1 Antitripsin deficiency, Anxiety masquerading as Airways dz,  ABPA,  allergy(esp in young), Aspiration (esp in elderly), Adverse effects of meds,  Active smokers, A bunch of PE's (a small clot burden can't cause this syndrome unless there is already severe underlying pulm or vascular dz with poor reserve) plus two Bs  = Bronchiectasis and Beta blocker use..and one C= CHF  Adherence is always the initial "prime suspect" and is a multilayered concern that requires a "trust but verify" approach in every patient - starting with knowing how to use medications, especially inhalers, correctly, keeping up with refills and understanding the fundamental difference between maintenance and prns vs those medications only taken for a very short course and then stopped and not refilled.  The proper method of use, as well as anticipated side effects, of a metered-dose inhaler are discussed and demonstrated to the patient. Improved effectiveness after extensive coaching during this visit to a level of approximately  75% so continue symbicort/spriva for now - approp use of rescue reviewed using ABC format > See instructions for specific recommendations which were reviewed directly with the patient who was given a copy with highlighter outlining the key components.   Active smoking > see sep a/p   Allergy/ asthma > seems unlikely but covered with symbicort    I had an extended discussion with the patient and wife  reviewing all relevant studies completed to date x 35 m     Each maintenance medication was reviewed in detail including most importantly the difference between maintenance and prns and under what circumstances the prns are to be triggered using an action plan format that is not reflected in the computer generated alphabetically organized AVS.    Please see instructions for  details which were reviewed in writing and the patient given a copy highlighting the part that I personally wrote and discussed at today's ov.

## 2014-10-27 NOTE — Assessment & Plan Note (Signed)
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

## 2014-10-27 NOTE — Assessment & Plan Note (Signed)
02 2lpm since [facility] admit 06/2014   Adequate control on present rx, reviewed > no change in rx needed

## 2014-10-30 ENCOUNTER — Telehealth: Payer: Self-pay | Admitting: Internal Medicine

## 2014-10-30 NOTE — Progress Notes (Signed)
Quick Note:  LMTCB ______ 

## 2014-10-30 NOTE — Telephone Encounter (Signed)
Patient states he just missed call back.  He may be reached at [phone].

## 2014-10-30 NOTE — Telephone Encounter (Signed)
Per CXR result note:  Result Note     Call pt: Reviewed cxr and looks better but be sure to keep f/u ov as need to make sure completely resolves  --  I spoke with patient about results and he verbalized understanding and had no questions

## 2014-12-08 ENCOUNTER — Encounter: Payer: Self-pay | Admitting: Internal Medicine

## 2014-12-08 ENCOUNTER — Ambulatory Visit (INDEPENDENT_AMBULATORY_CARE_PROVIDER_SITE_OTHER): Payer: Medicaid Other | Admitting: Internal Medicine

## 2014-12-08 VITALS — BP 126/70 | HR 93 | Ht 67.5 in | Wt 213.0 lb

## 2014-12-08 DIAGNOSIS — J852 Abscess of lung without pneumonia: Secondary | ICD-10-CM

## 2014-12-08 DIAGNOSIS — J9611 Chronic respiratory failure with hypoxia: Secondary | ICD-10-CM | POA: Diagnosis not present

## 2014-12-08 DIAGNOSIS — Z720 Tobacco use: Secondary | ICD-10-CM

## 2014-12-08 DIAGNOSIS — E669 Obesity, unspecified: Secondary | ICD-10-CM

## 2014-12-08 DIAGNOSIS — J449 Chronic obstructive pulmonary disease, unspecified: Secondary | ICD-10-CM | POA: Diagnosis not present

## 2014-12-08 DIAGNOSIS — F17210 Nicotine dependence, cigarettes, uncomplicated: Secondary | ICD-10-CM

## 2014-12-08 LAB — PULMONARY FUNCTION TEST
DL/VA % pred: 77 %
DL/VA: 3.47 ml/min/mmHg/L
DLCO UNC: 12.81 ml/min/mmHg
DLCO unc % pred: 44 %
FEF 25-75 Post: 0.45 L/sec
FEF 25-75 Pre: 0.34 L/sec
FEF2575-%Change-Post: 32 %
FEF2575-%Pred-Post: 16 %
FEF2575-%Pred-Pre: 12 %
FEV1-%CHANGE-POST: 23 %
FEV1-%PRED-POST: 30 %
FEV1-%PRED-PRE: 24 %
FEV1-Post: 0.99 L
FEV1-Pre: 0.8 L
FEV1FVC-%Change-Post: 14 %
FEV1FVC-%PRED-PRE: 57 %
FEV6-%Change-Post: 7 %
FEV6-%PRED-POST: 46 %
FEV6-%PRED-PRE: 43 %
FEV6-POST: 1.92 L
FEV6-Pre: 1.79 L
FEV6FVC-%CHANGE-POST: 0 %
FEV6FVC-%PRED-POST: 101 %
FEV6FVC-%Pred-Pre: 101 %
FVC-%Change-Post: 8 %
FVC-%Pred-Post: 46 %
FVC-%Pred-Pre: 42 %
FVC-PRE: 1.85 L
FVC-Post: 2 L
Post FEV1/FVC ratio: 50 %
Post FEV6/FVC ratio: 96 %
Pre FEV1/FVC ratio: 44 %
Pre FEV6/FVC Ratio: 97 %
RV % PRED: 175 %
RV: 3.72 L
TLC % PRED: 90 %
TLC: 5.9 L

## 2014-12-08 MED ORDER — ACLIDINIUM BROMIDE 400 MCG/ACT IN AEPB: 1.0000 | INHALATION_SPRAY | Freq: Two times a day (BID) | RESPIRATORY_TRACT | Status: DC

## 2014-12-08 NOTE — Progress Notes (Signed)
PFT done today. 

## 2014-12-08 NOTE — Progress Notes (Signed)
Subjective:     Patient ID: Troy Bates, male   DOB: 31-Aug-1955    MRN: [id number]   Brief patient profile:  59 yowm obese smoker with new onset doe x 2014 placed on 02 after admit to [facility]:   Admit date: 05/18/2014 Discharge date: 05/20/2014  Time spent: 45 minutes  Recommendations for Outpatient Follow-up:  -Will be discharged home today. -Advised to follow up with PCP in 2 weeks.  Discharge Diagnoses:  Active Problems:  COPD exacerbation  Acute on chronic respiratory failure  Tobacco abuse   Discharge Condition: Stable and improved  Filed Weights   05/18/14 1430  Weight: 86.183 kg (190 lb)    History of present illness:  This is a 59 year old man who has a history of COPD and continues to smoke half a pack of cigarettes a day now presents with 3-4 day history of increasing shortness of breath associated with a cough productive of green/brown sputum. He is usually oxygen dependent and has 2 L/m oxygen at home. He denies any chest pain, palpitations, fever, nausea or vomiting. Treatment in the emergency room has failed to improve him to a level that he feels he can go home. He is now being admitted for treatment of his exacerbation of COPD.   Hospital Course:   Acute on Chronic Respiratory failure -2/2 COPD. -See below for details.  COPD with Acute Exacerbation -Not optimally medicated at home. -Add symbicort/spiriva and PRN albuterol MDI. -Continue steroid taper.      10/27/2014 1st [facility] office visit/ Charleene Callegari  / 02 2lpm 24/7 but still smoking "chantix too expensive" Chief Complaint  Patient presents with  . Advice Only    COPD; RLL Lesion on CT Chest; pain in chest when cough or sneeze; SOB most of time; chest tightness; prod cough w/green mucus   at his best still wears 02 2lpm 24/7 and can do some shopping like at [facility] / uses [facility] parking / can't do [employer]  Worse than usual since 3-4 weeks prior to OV   With abrupt worsening cough >  variably productive of bloody mucus but usually green less so finishing up 9/10 days levaquin with ct suggestive of cavitary pna RLL 10/18/14  No pain on deep breathing  Already took am symb/spiriva but very confused with use of saba in various forms  rec Please remember to go to the  x-ray department downstairs for your tests - we will call you with the results when they are available. Plan A = automatic = symbicort /spiriva Plan B = backup Only use your albuterol as a rescue medication to be used if you can't catch your breath  Plan C = crisis  Only use albuterol  2.5 up to every 4 hours if you must  office visit in 4 weeks, sooner if needed with pfts and all medications   12/08/2014 f/u ov/Eddie Koc re: GOLD III/IV min reversibility/ still smoking  Chief Complaint  Patient presents with  . Follow-up    PFT done today. Pt states that his breathing seems some better. He states he has been bloated and does get SOB when he eats.     Walking to [facility] on 02 2lpm and oob on return but doesn't need to stop slow pace   No obvious patterns in day to day or daytime variability or assoc cough or   cp or chest tightness, subjective wheeze or overt sinus or hb symptoms. No unusual exp hx or h/o childhood pna/ asthma or knowledge of  premature birth.  Sleeping ok without nocturnal  or early am exacerbation  of respiratory  c/o's or need for noct saba. Also denies any obvious fluctuation of symptoms with weather or environmental changes or other aggravating or alleviating factors except as outlined above   Current Medications, Allergies, Complete Past Medical History, Past Surgical History, Family History, and Social History were reviewed in [employer] record.  ROS  The following are not active complaints unless bolded sore throat, dysphagia, dental problems, itching, sneezing,  nasal congestion or excess/ purulent secretions, ear ache,   fever, chills, sweats, unintended wt loss,  classically pleuritic or exertional cp, hemoptysis,  orthopnea pnd or leg swelling, presyncope, palpitations, abdominal pain anorexia, nausea, vomiting, diarrhea  or change in bowel or bladder habits, change in stools or urine, dysuria,hematuria,  rash, arthralgias, visual complaints, headache, numbness, weakness or ataxia or problems with walking or coordination,  change in mood/affect or memory.              Objective:   Physical Exam    amb obese wm nad     12/08/2014           213  Wt Readings from Last 3 Encounters:  10/27/14 211 lb (95.709 kg)  05/28/14 190 lb (86.183 kg)  05/18/14 190 lb (86.183 kg)    Vital signs reviewed   HEENT: nl dentition, turbinates, and orophanx. Nl external ear canals without cough reflex   NECK :  without JVD/Nodes/TM/ nl carotid upstrokes bilaterally   LUNGS: no acc muscle use, distant bs bilaterally with end exp wheeze    CV:  RRR  no s3 or murmur or increase in P2, no edema   ABD:  Tensely obese  nontender with nl excursion in the supine position. No bruits or organomegaly, bowel sounds nl  MS:  warm without deformities, calf tenderness, cyanosis or clubbing  SKIN: warm and dry without lesions    NEURO:  alert, approp, no deficits   CXR PA and Lateral:   10/27/2014 :     I personally reviewed images and agree with radiology impression as follows:   1. Faint density likely corresponding to the right lower lobe cavitary nodule on the recent chest CT, possibly improved compared to the prior radiographs. 2. COPD. No evidence of new abnormality in the chest.        Assessment:

## 2014-12-08 NOTE — Patient Instructions (Addendum)
The key is to stop smoking completely before smoking completely stops you!   Try tudorza one twice daily instead of spiriva and if you like it fill it  Please schedule a follow up office visit in 6 weeks, call sooner if needed  - late add needs f/u cxr/ placed in tickle

## 2014-12-10 ENCOUNTER — Encounter: Payer: Self-pay | Admitting: Internal Medicine

## 2014-12-10 DIAGNOSIS — E669 Obesity, unspecified: Secondary | ICD-10-CM | POA: Insufficient documentation

## 2014-12-10 NOTE — Assessment & Plan Note (Addendum)
-   10/27/2014 p extensive coaching HFA effectiveness =    75% from a baseline of 25%  - PFT's  12/08/2014  FEV1 .99 (30 % ) ratio 50  p 23 % improvement from saba with DLCO  44 % corrects to 77 % for alv volume    Although 23% versus ability seems like a lot, but because the denominator is so small that small changes in numerator (the fev 1 after saba) are magnified and really don't meedt the 200 cc ats criteria. This is very severe copd and likely to continue to progress unless/ until he stops smoking.  One of his major concerns is abd bloating ? Related to anticholinergic effects of spiriva > try tudorza  The proper method of use, as well as anticipated side effects, of a metered-dose / dpi inhaler are discussed and demonstrated to the patient. Improved effectiveness after extensive coaching during this visit to a level of approximately  90%   I had an extended discussion with the patient reviewing all relevant studies completed to date and  lasting 15 to 20 minutes of a 25 minute visit    Each maintenance medication was reviewed in detail including most importantly the difference between maintenance and prns and under what circumstances the prns are to be triggered using an action plan format that is not reflected in the computer generated alphabetically organized AVS.    Please see instructions for details which were reviewed in writing and the patient given a copy highlighting the part that I personally wrote and discussed at today's ov.

## 2014-12-10 NOTE — Assessment & Plan Note (Signed)
See CT chest 10/18/14 [facility] > rx with levaquin x 10 d   completed 10/28/14   Clinically all of his acute symptoms have resolved so he needs a follow-up chest x-ray on his next visit acknowledging that it takes months to completely clear infiltrates associated with necrotic processes in the lung

## 2014-12-10 NOTE — Assessment & Plan Note (Signed)
Body mass index is 32.85 kg/(m^2).  No results found for: TSH   Contributing to gerd tendency/ doe/reviewed need  achieve and maintain neg calorie balance > defer f/u primary care including intermittently monitoring thyroid status

## 2014-12-10 NOTE — Assessment & Plan Note (Signed)
02 2lpm since [facility] admit 06/2014  - RA sat 12/08/2014  = 97% - 12/08/2014   Walked 2lpm x one lap @ 185 stopped due to  Sob/ slow pace, no desat   rec as of 12/08/2014 :  02 with exertion and sleep and prn otherwise

## 2014-12-10 NOTE — Assessment & Plan Note (Signed)
>   3 m  I took an extended  opportunity with this patient to outline the consequences of continued cigarette use  in airway disorders based on all the data we have from the multiple national lung health studies (perfomed over decades at millions of dollars in cost)  indicating that smoking cessation, not choice of inhalers or physicians, is the most important aspect of care.   

## 2015-01-01 ENCOUNTER — Telehealth: Payer: Self-pay | Admitting: Internal Medicine

## 2015-01-01 MED ORDER — ACLIDINIUM BROMIDE 400 MCG/ACT IN AEPB: 1.0000 | INHALATION_SPRAY | Freq: Two times a day (BID) | RESPIRATORY_TRACT | Status: AC

## 2015-01-01 NOTE — Telephone Encounter (Signed)
Spoke with pt. Needs a rx for Tudorza sent to his pharmacy. This has been done. Nothing further was needed.

## 2015-01-03 ENCOUNTER — Encounter (HOSPITAL_COMMUNITY): Payer: Self-pay | Admitting: *Deleted

## 2015-01-03 ENCOUNTER — Emergency Department (HOSPITAL_COMMUNITY): Payer: Medicaid Other

## 2015-01-03 ENCOUNTER — Telehealth: Payer: Self-pay | Admitting: *Deleted

## 2015-01-03 ENCOUNTER — Inpatient Hospital Stay (HOSPITAL_COMMUNITY)
Admission: EM | Admit: 2015-01-03 | Discharge: 2015-01-06 | DRG: 190 | Disposition: A | Discharge status: Home / Self Care | Payer: Medicaid Other | Attending: Internal Medicine | Admitting: Internal Medicine

## 2015-01-03 DIAGNOSIS — I10 Essential (primary) hypertension: Secondary | ICD-10-CM | POA: Diagnosis not present

## 2015-01-03 DIAGNOSIS — D72829 Elevated white blood cell count, unspecified: Secondary | ICD-10-CM | POA: Diagnosis present

## 2015-01-03 DIAGNOSIS — Z9981 Dependence on supplemental oxygen: Secondary | ICD-10-CM | POA: Diagnosis not present

## 2015-01-03 DIAGNOSIS — J9620 Acute and chronic respiratory failure, unspecified whether with hypoxia or hypercapnia: Secondary | ICD-10-CM | POA: Diagnosis present

## 2015-01-03 DIAGNOSIS — G4730 Sleep apnea, unspecified: Secondary | ICD-10-CM | POA: Diagnosis present

## 2015-01-03 DIAGNOSIS — F17210 Nicotine dependence, cigarettes, uncomplicated: Secondary | ICD-10-CM | POA: Diagnosis present

## 2015-01-03 DIAGNOSIS — Z720 Tobacco use: Secondary | ICD-10-CM | POA: Diagnosis not present

## 2015-01-03 DIAGNOSIS — E871 Hypo-osmolality and hyponatremia: Secondary | ICD-10-CM | POA: Diagnosis present

## 2015-01-03 DIAGNOSIS — Z825 Family history of asthma and other chronic lower respiratory diseases: Secondary | ICD-10-CM | POA: Diagnosis not present

## 2015-01-03 DIAGNOSIS — Z9119 Patient's noncompliance with other medical treatment and regimen: Secondary | ICD-10-CM | POA: Diagnosis not present

## 2015-01-03 DIAGNOSIS — E669 Obesity, unspecified: Secondary | ICD-10-CM | POA: Diagnosis not present

## 2015-01-03 DIAGNOSIS — Z6833 Body mass index (BMI) 33.0-33.9, adult: Secondary | ICD-10-CM | POA: Diagnosis not present

## 2015-01-03 DIAGNOSIS — R0602 Shortness of breath: Secondary | ICD-10-CM | POA: Diagnosis not present

## 2015-01-03 DIAGNOSIS — J441 Chronic obstructive pulmonary disease with (acute) exacerbation: Principal | ICD-10-CM | POA: Diagnosis present

## 2015-01-03 DIAGNOSIS — R609 Edema, unspecified: Secondary | ICD-10-CM

## 2015-01-03 DIAGNOSIS — T380X5A Adverse effect of glucocorticoids and synthetic analogues, initial encounter: Secondary | ICD-10-CM | POA: Diagnosis present

## 2015-01-03 HISTORY — DX: Localized edema: R60.0

## 2015-01-03 HISTORY — DX: Morbid (severe) obesity due to excess calories: E66.01

## 2015-01-03 HISTORY — DX: Tobacco use: Z72.0

## 2015-01-03 LAB — COMPREHENSIVE METABOLIC PANEL
ALBUMIN: 3.5 g/dL (ref 3.5–5.0)
ALK PHOS: 59 U/L (ref 38–126)
ALT: 20 U/L (ref 17–63)
AST: 17 U/L (ref 15–41)
Anion gap: 7 (ref 5–15)
BILIRUBIN TOTAL: 0.5 mg/dL (ref 0.3–1.2)
BUN: 12 mg/dL (ref 6–20)
CALCIUM: 9 mg/dL (ref 8.9–10.3)
CO2: 32 mmol/L (ref 22–32)
CREATININE: 0.99 mg/dL (ref 0.61–1.24)
Chloride: 98 mmol/L — ABNORMAL LOW (ref 101–111)
GFR calc Af Amer: >60 mL/min (ref >60)
GLUCOSE: 111 mg/dL — AB (ref 65–99)
POTASSIUM: 4.4 mmol/L (ref 3.5–5.1)
Sodium: 137 mmol/L (ref 135–145)
TOTAL PROTEIN: 6.5 g/dL (ref 6.5–8.1)

## 2015-01-03 LAB — CBC WITH DIFFERENTIAL/PLATELET
BASOS ABS: 0.1 10*3/uL (ref 0.0–0.1)
BASOS PCT: 0 %
Eosinophils Absolute: 0.3 10*3/uL (ref 0.0–0.7)
Eosinophils Relative: 2 %
HEMATOCRIT: 45.1 % (ref 39.0–52.0)
HEMOGLOBIN: 15.5 g/dL (ref 13.0–17.0)
LYMPHS PCT: 17 %
Lymphs Abs: 2.5 10*3/uL (ref 0.7–4.0)
MCH: 31 pg (ref 26.0–34.0)
MCHC: 34.4 g/dL (ref 30.0–36.0)
MCV: 90.2 fL (ref 78.0–100.0)
MONO ABS: 1.1 10*3/uL — AB (ref 0.1–1.0)
Monocytes Relative: 7 %
NEUTROS ABS: 10.7 10*3/uL — AB (ref 1.7–7.7)
NEUTROS PCT: 74 %
Platelets: 320 10*3/uL (ref 150–400)
RBC: 5 MIL/uL (ref 4.22–5.81)
RDW: 13.9 % (ref 11.5–15.5)
WBC: 14.5 10*3/uL — ABNORMAL HIGH (ref 4.0–10.5)

## 2015-01-03 LAB — I-STAT TROPONIN, ED: TROPONIN I, POC: 0 ng/mL (ref 0.00–0.08)

## 2015-01-03 LAB — BRAIN NATRIURETIC PEPTIDE: B Natriuretic Peptide: 17 pg/mL (ref 0.0–100.0)

## 2015-01-03 MED ORDER — ALBUTEROL SULFATE (2.5 MG/3ML) 0.083% IN NEBU
2.5000 mg | INHALATION_SOLUTION | RESPIRATORY_TRACT | Status: DC | PRN
  Administered 2015-01-04: 2.5 mg via RESPIRATORY_TRACT
  Filled 2015-01-03: qty 3

## 2015-01-03 MED ORDER — METHYLPREDNISOLONE SODIUM SUCC 125 MG IJ SOLR
80.0000 mg | Freq: Four times a day (QID) | INTRAMUSCULAR | Status: DC
  Administered 2015-01-03 – 2015-01-04 (×3): 80 mg via INTRAVENOUS
  Filled 2015-01-03 (×3): qty 2

## 2015-01-03 MED ORDER — FUROSEMIDE 10 MG/ML IJ SOLN
40.0000 mg | Freq: Once | INTRAMUSCULAR | Status: AC
  Administered 2015-01-03: 40 mg via INTRAVENOUS
  Filled 2015-01-03: qty 4

## 2015-01-03 MED ORDER — IPRATROPIUM BROMIDE 0.02 % IN SOLN
0.5000 mg | Freq: Once | RESPIRATORY_TRACT | Status: AC
  Administered 2015-01-03: 0.5 mg via RESPIRATORY_TRACT
  Filled 2015-01-03: qty 2.5

## 2015-01-03 MED ORDER — INFLUENZA VAC SPLIT QUAD 0.5 ML IM SUSY
0.5000 mL | PREFILLED_SYRINGE | INTRAMUSCULAR | Status: AC
  Administered 2015-01-04: 0.5 mL via INTRAMUSCULAR
  Filled 2015-01-03: qty 0.5

## 2015-01-03 MED ORDER — ONDANSETRON HCL 4 MG/2ML IJ SOLN: 4.0000 mg | Freq: Four times a day (QID) | INTRAMUSCULAR | Status: DC | PRN

## 2015-01-03 MED ORDER — PNEUMOCOCCAL VAC POLYVALENT 25 MCG/0.5ML IJ INJ
0.5000 mL | INJECTION | INTRAMUSCULAR | Status: DC
  Filled 2015-01-03: qty 0.5

## 2015-01-03 MED ORDER — IPRATROPIUM-ALBUTEROL 0.5-2.5 (3) MG/3ML IN SOLN
3.0000 mL | Freq: Once | RESPIRATORY_TRACT | Status: AC
  Administered 2015-01-03: 3 mL via RESPIRATORY_TRACT
  Filled 2015-01-03: qty 3

## 2015-01-03 MED ORDER — METHYLPREDNISOLONE SODIUM SUCC 125 MG IJ SOLR: 125.0000 mg | Freq: Once | INTRAMUSCULAR | Status: DC

## 2015-01-03 MED ORDER — LEVOFLOXACIN 500 MG PO TABS
500.0000 mg | ORAL_TABLET | Freq: Every day | ORAL | Status: DC
  Administered 2015-01-03 – 2015-01-06 (×4): 500 mg via ORAL
  Filled 2015-01-03 (×4): qty 1

## 2015-01-03 MED ORDER — ENOXAPARIN SODIUM 40 MG/0.4ML ~~LOC~~ SOLN
40.0000 mg | SUBCUTANEOUS | Status: DC
  Administered 2015-01-03 – 2015-01-05 (×3): 40 mg via SUBCUTANEOUS
  Filled 2015-01-03 (×3): qty 0.4

## 2015-01-03 MED ORDER — ALBUTEROL (5 MG/ML) CONTINUOUS INHALATION SOLN
10.0000 mg/h | INHALATION_SOLUTION | RESPIRATORY_TRACT | Status: DC
  Administered 2015-01-03: 10 mg/h via RESPIRATORY_TRACT
  Filled 2015-01-03: qty 20

## 2015-01-03 MED ORDER — BUDESONIDE-FORMOTEROL FUMARATE 160-4.5 MCG/ACT IN AERO
2.0000 | INHALATION_SPRAY | Freq: Two times a day (BID) | RESPIRATORY_TRACT | Status: DC
  Administered 2015-01-04 – 2015-01-06 (×5): 2 via RESPIRATORY_TRACT
  Filled 2015-01-03: qty 6

## 2015-01-03 MED ORDER — TIOTROPIUM BROMIDE MONOHYDRATE 18 MCG IN CAPS
18.0000 ug | ORAL_CAPSULE | Freq: Every day | RESPIRATORY_TRACT | Status: DC
  Administered 2015-01-04 – 2015-01-06 (×3): 18 ug via RESPIRATORY_TRACT
  Filled 2015-01-03: qty 5

## 2015-01-03 MED ORDER — ONDANSETRON HCL 4 MG PO TABS: 4.0000 mg | ORAL_TABLET | Freq: Four times a day (QID) | ORAL | Status: DC | PRN

## 2015-01-03 NOTE — H&P (Signed)
[facility] History and Physical  Troy Bates WUJ:[id number] DOB: 15-Sep-1955 DOA: 01/03/2015  Referring physician: ER PCP: Newman Nip, NP   Chief Complaint: Dyspnea  HPI: Troy Bates is a 59 y.o. male  This is a 59 year old man, long-standing smoker who apparently quit smoking couple of months ago, who presents with a two-week history of dyspnea associated with a cough productive of yellow sputum. He denies any fever. He denies any chest pain, palpitations or limb weakness. When he arrived in the emergency room, he appeared to be in significant respiratory distress and was put on BiPAP. However, after bronchodilating, he has improved and is now requiring nasal cannula oxygen. He is normally on 2 L oxygen per minute at home. He is not on oral steroids chronically. He is now being admitted for further management.   Review of Systems:  Apart from symptoms above, all systems are negative..   Past Medical History  Diagnosis Date  . COPD (chronic obstructive pulmonary disease)   . Asthma   . Hypertension   . Sleep apnea    Past Surgical History  Procedure Laterality Date  . Back surgery    . Hernia repair    . Foot surgery     Social History:  reports that he has been smoking Cigarettes.  He has been smoking about 0.25 packs per day. He has never used smokeless tobacco. He reports that he does not drink alcohol or use illicit drugs.  Allergies  Allergen Reactions  . Sudafed [Pseudoephedrine Hcl] Rash    Family History  Problem Relation Age of Onset  . Emphysema Paternal Aunt     Prior to Admission medications   Medication Sig Start Date End Date Taking? Authorizing Provider  Aclidinium Bromide (TUDORZA PRESSAIR) 400 MCG/ACT AEPB Inhale 1 puff into the lungs 2 (two) times daily. One twice daily 01/01/15  Yes Nyoka Cowden, MD  albuterol (PROVENTIL) (2.5 MG/3ML) 0.083% nebulizer solution Take 3 mLs (2.5 mg total) by nebulization every 2 (two) hours as  needed for wheezing. 01/02/14  Yes Maryann Mikhail, DO  budesonide-formoterol (SYMBICORT) 160-4.5 MCG/ACT inhaler Inhale 2 puffs into the lungs 2 (two) times daily. 05/20/14  Yes Estela Isaiah Blakes, MD  albuterol (PROVENTIL HFA;VENTOLIN HFA) 108 (90 BASE) MCG/ACT inhaler Inhale 2 puffs into the lungs every 6 (six) hours as needed for wheezing or shortness of breath.    Historical Provider, MD  dextromethorphan-guaiFENesin (MUCINEX DM) 30-600 MG per 12 hr tablet Take 1 tablet by mouth 2 (two) times daily. Patient not taking: Reported on 01/03/2015 01/02/14   Nita Sells Mikhail, DO  tiotropium (SPIRIVA) 18 MCG inhalation capsule Place 1 capsule (18 mcg total) into inhaler and inhale daily. Patient not taking: Reported on 01/03/2015 05/20/14   Henderson Cloud, MD   Physical Exam: Filed Vitals:   01/03/15 1400 01/03/15 1430 01/03/15 1500 01/03/15 1632  BP: 134/93 116/84 118/85   Pulse: 109 117 122   Temp:      TempSrc:      Resp: Height:      Weight:      SpO2: 99% 99% 92% 95%    Wt Readings from Last 3 Encounters:  01/03/15 99.791 kg (220 lb)  12/08/14 96.616 kg (213 lb)  10/27/14 95.709 kg (211 lb)    General:  Appears calm and comfortable. There is no significant work of breathing at the present time. There is no peripheral or central cyanosis. Eyes: PERRL, normal lids, irises &  conjunctiva ENT: grossly normal hearing, lips & tongue Neck: no LAD, masses or thyromegaly Cardiovascular: RRR, no m/r/g. No LE edema. No clinical evidence of heart failure. Telemetry: SR, no arrhythmias  Respiratory: Air entry is reduced bilaterally. There is no significant wheezing at the present time. There are no crackles or bronchial breathing. Abdomen: soft, ntnd Skin: no rash or induration seen on limited exam Musculoskeletal: grossly normal tone BUE/BLE Psychiatric: grossly normal mood and affect, speech fluent and appropriate Neurologic: grossly non-focal.          Labs on  Admission:  Basic Metabolic Panel:  Recent Labs Lab 01/03/15 1317  NA 137  K 4.4  CL 98*  CO2 32  GLUCOSE 111*  BUN 12  CREATININE 0.99  CALCIUM 9.0   Liver Function Tests:  Recent Labs Lab 01/03/15 1317  AST 17  ALT 20  ALKPHOS 59  BILITOT 0.5  PROT 6.5  ALBUMIN 3.5   No results for input(s): LIPASE, AMYLASE in the last 168 hours. No results for input(s): AMMONIA in the last 168 hours. CBC:  Recent Labs Lab 01/03/15 1317  WBC 14.5*  NEUTROABS 10.7*  HGB 15.5  HCT 45.1  MCV 90.2  PLT 320   Cardiac Enzymes: No results for input(s): CKTOTAL, CKMB, CKMBINDEX, TROPONINI in the last 168 hours.  BNP (last 3 results)  Recent Labs  05/18/14 1447 01/03/15 1317  BNP 33.0 17.0    ProBNP (last 3 results) No results for input(s): PROBNP in the last 8760 hours.  CBG: No results for input(s): GLUCAP in the last 168 hours.  Radiological Exams on Admission: Dg Chest Portable 1 View  01/03/2015   CLINICAL DATA:  Productive cough, congestion and shortness of breath for 2 weeks.  EXAM: PORTABLE CHEST 1 VIEW  COMPARISON:  CT chest 10/18/2014.  PA and lateral chest 10/27/2014.  FINDINGS: The lungs are emphysematous but clear. No pneumothorax or pleural effusion. Heart size normal.  IMPRESSION: Emphysema without acute disease.   Electronically Signed   By: Drusilla Kanner M.D.   On: 01/03/2015 13:19      Assessment/Plan   1. COPD exacerbation. He will be treated with IV steroids, oral antibiotics and bronchial dilators.  He'll be admitted to the medical floor. Further recommendations will depend on patient's hospital progress.  Code Status: Full code.   DVT Prophylaxis: Lovenox.  Family Communication: I discussed the plan with the patient at the bedside.  Disposition Plan: Home when medically stable.   Time spent: 60 minutes.  Wilson Singer [facility] Pager [phone].

## 2015-01-03 NOTE — Telephone Encounter (Signed)
PA approved for Turdoza thru [place] tracks. [id number] Good from 01/03/15 to 12/29/15.  Pt ID# [id number] T Pharmacy informed.

## 2015-01-03 NOTE — ED Notes (Signed)
MD at bedside. 

## 2015-01-03 NOTE — ED Provider Notes (Signed)
CSN: [id number]     Arrival date & time 01/03/15  1241 History   First MD Initiated Contact with Patient 01/03/15 1248     Chief Complaint  Patient presents with  . Shortness of Breath     (Consider location/radiation/quality/duration/timing/severity/associated sxs/prior Treatment) HPI Comments: 59 y.o. Male with history of COPD, respiratory distress, obesity, chronic daily smoker presents for shortness of breath.  The patient reports that the shortness of breath has been progressive over the last few weeks and that he has also noted that he is filling up with fluid over this time.  He has had some intermittent chest pain.  No fever or chills.  He has a chronic cough and at times is brining up sputum but it is non bloody and sometimes greenish in color.  His abdomen and bilateral legs are swelling.    Patient is a 59 y.o. male presenting with shortness of breath.  Shortness of Breath Associated symptoms: chest pain (intermittent and more of a tight feeling) and cough   Associated symptoms: no abdominal pain, no fever, no rash and no vomiting     Past Medical History  Diagnosis Date  . COPD (chronic obstructive pulmonary disease)   . Asthma   . Hypertension   . Sleep apnea    Past Surgical History  Procedure Laterality Date  . Back surgery    . Hernia repair    . Foot surgery     Family History  Problem Relation Age of Onset  . Emphysema Paternal Aunt    Social History  Substance Use Topics  . Smoking status: Light Tobacco Smoker -- 0.25 packs/day    Types: Cigarettes  . Smokeless tobacco: Never Used  . Alcohol Use: No    Review of Systems  Constitutional: Positive for fatigue. Negative for fever, chills and appetite change.  HENT: Negative for congestion, postnasal drip and rhinorrhea.   Respiratory: Positive for cough, chest tightness and shortness of breath.   Cardiovascular: Positive for chest pain (intermittent and more of a tight feeling) and leg swelling  (bilateral, symmetric as well as the abdomen).  Gastrointestinal: Positive for abdominal distention. Negative for nausea, vomiting, abdominal pain, diarrhea and constipation.  Genitourinary: Negative for dysuria, urgency and hematuria.  Musculoskeletal: Negative for myalgias and back pain.  Skin: Negative for rash.  Neurological: Negative for dizziness, seizures, weakness and numbness.  Hematological: Negative for adenopathy.      Allergies  Sudafed  Home Medications   Prior to Admission medications   Medication Sig Start Date End Date Taking? Authorizing Hadlie Gipson  Aclidinium Bromide (TUDORZA PRESSAIR) 400 MCG/ACT AEPB Inhale 1 puff into the lungs 2 (two) times daily. One twice daily 01/01/15  Yes Nyoka Cowden, MD  albuterol (PROVENTIL) (2.5 MG/3ML) 0.083% nebulizer solution Take 3 mLs (2.5 mg total) by nebulization every 2 (two) hours as needed for wheezing. 01/02/14  Yes Maryann Mikhail, DO  budesonide-formoterol (SYMBICORT) 160-4.5 MCG/ACT inhaler Inhale 2 puffs into the lungs 2 (two) times daily. 05/20/14  Yes Estela Isaiah Blakes, MD  albuterol (PROVENTIL HFA;VENTOLIN HFA) 108 (90 BASE) MCG/ACT inhaler Inhale 2 puffs into the lungs every 6 (six) hours as needed for wheezing or shortness of breath.    Historical Jamera Vanloan, MD  dextromethorphan-guaiFENesin (MUCINEX DM) 30-600 MG per 12 hr tablet Take 1 tablet by mouth 2 (two) times daily. Patient not taking: Reported on 01/03/2015 01/02/14   Nita Sells Mikhail, DO  tiotropium (SPIRIVA) 18 MCG inhalation capsule Place 1 capsule (18 mcg total) into  inhaler and inhale daily. Patient not taking: Reported on 01/03/2015 05/20/14   Henderson Cloud, MD   BP 140/90 mmHg  Pulse 109  Temp(Src) 97.7 F (36.5 C) (Oral)  Resp 20  Ht  (1.727 m)  Wt 220 lb (99.791 kg)  BMI 33.46 kg/m2  SpO2 97% Physical Exam  Constitutional: He is oriented to person, place, and time. He appears distressed (respiratory distress).  HENT:  Head:  Normocephalic and atraumatic.  Right Ear: External ear normal.  Left Ear: External ear normal.  Mouth/Throat: Oropharynx is clear and moist. No oropharyngeal exudate.  Eyes: EOM are normal. Pupils are equal, round, and reactive to light.  Neck: Normal range of motion. Neck supple. No JVD present. No tracheal deviation present.  Cardiovascular: Regular rhythm.  Tachycardia present.  Exam reveals no decreased pulses.   No murmur heard. Pulmonary/Chest: He is in respiratory distress. He has decreased breath sounds (bilateral). He has wheezes (few secondary to poor air exchange).  Abdominal: Soft. He exhibits distension. He exhibits no mass. There is no tenderness. There is no rebound and no guarding.  Musculoskeletal: He exhibits edema (edema of the abdomen noted as well as 2+ pitting of the bilateral lower extremities which is symmetric).  Neurological: He is alert and oriented to person, place, and time.  Skin: Skin is warm and dry. He is not diaphoretic.  Vitals reviewed.   ED Course  Procedures (including critical care time) Labs Review Labs Reviewed  CBC WITH DIFFERENTIAL/PLATELET  COMPREHENSIVE METABOLIC PANEL  BRAIN NATRIURETIC PEPTIDE  I-STAT TROPOININ, ED    Imaging Review Dg Chest Portable 1 View  01/03/2015   CLINICAL DATA:  Productive cough, congestion and shortness of breath for 2 weeks.  EXAM: PORTABLE CHEST 1 VIEW  COMPARISON:  CT chest 10/18/2014.  PA and lateral chest 10/27/2014.  FINDINGS: The lungs are emphysematous but clear. No pneumothorax or pleural effusion. Heart size normal.  IMPRESSION: Emphysema without acute disease.   Electronically Signed   By: Drusilla Kanner M.D.   On: 01/03/2015 13:19   I have personally reviewed and evaluated these images and lab results as part of my medical decision-making.   EKG Interpretation   Date/Time:  Wednesday January 03 2015 12:42:38 EDT Ventricular Rate:  104 PR Interval:  136 QRS Duration: 80 QT Interval:   325 QTC Calculation: 427 R Axis:   16 Text Interpretation:  Sinus tachycardia LAE, consider biatrial enlargement  Low voltage, extremity leads Abnormal inferior Q waves No significant  change since last tracing Confirmed by NGUYEN, EMILY ([id number]) on 01/03/2015  1:26:13 PM      MDM  Patient was seen and evaluated at bedside in respiratory distress at presentation.  Concern for COPD exacerbation and possible CHF.  PAtient given solumedrol, breathing treatment, and lasix with improvement.  Chest xray consistent with COPD without acute process.  BNP and trop normal.  WBC elevated but other results unremarkable for patient.  Patient required continued breathing treatments.  Discussed with physician on call for hospitalists who agreed with admission and patient was admitted for continued treatment. Final diagnoses:  None    1. COPD exacerbation  2. Respiratory distress    Leta Baptist, MD 01/05/15 [phone]

## 2015-01-03 NOTE — ED Notes (Signed)
125 solumedrol and albuterol/atrovent given in route, pt now 98% on 4L.

## 2015-01-03 NOTE — ED Notes (Signed)
Pt comes in via EMS for respiratory distress. Pt also has pitting edema in lower extremities. Pt uses 2 L at all times. IV established.

## 2015-01-03 NOTE — ED Notes (Signed)
Pt c/o increasing SOB over the last couple weeks. Pt usually wears 2L O2 via [place] at home, EMS placed pt on 4L with sats at 95%. Pt also c/o "very tight" abdomen. Pt also c/o increased swelling in BLE.

## 2015-01-04 ENCOUNTER — Inpatient Hospital Stay (HOSPITAL_COMMUNITY): Payer: Medicaid Other

## 2015-01-04 ENCOUNTER — Encounter (HOSPITAL_COMMUNITY): Payer: Self-pay | Admitting: Internal Medicine

## 2015-01-04 DIAGNOSIS — G4730 Sleep apnea, unspecified: Secondary | ICD-10-CM | POA: Diagnosis present

## 2015-01-04 DIAGNOSIS — E871 Hypo-osmolality and hyponatremia: Secondary | ICD-10-CM | POA: Diagnosis present

## 2015-01-04 DIAGNOSIS — D72829 Elevated white blood cell count, unspecified: Secondary | ICD-10-CM | POA: Diagnosis present

## 2015-01-04 DIAGNOSIS — I10 Essential (primary) hypertension: Secondary | ICD-10-CM | POA: Diagnosis present

## 2015-01-04 DIAGNOSIS — J9620 Acute and chronic respiratory failure, unspecified whether with hypoxia or hypercapnia: Secondary | ICD-10-CM | POA: Diagnosis present

## 2015-01-04 DIAGNOSIS — Z720 Tobacco use: Secondary | ICD-10-CM | POA: Diagnosis present

## 2015-01-04 LAB — COMPREHENSIVE METABOLIC PANEL
ALBUMIN: 3.5 g/dL (ref 3.5–5.0)
ALT: 20 U/L (ref 17–63)
ANION GAP: 9 (ref 5–15)
AST: 29 U/L (ref 15–41)
Alkaline Phosphatase: 53 U/L (ref 38–126)
BUN: 16 mg/dL (ref 6–20)
CO2: 27 mmol/L (ref 22–32)
Calcium: 8.9 mg/dL (ref 8.9–10.3)
Chloride: 97 mmol/L — ABNORMAL LOW (ref 101–111)
Creatinine, Ser: 1.05 mg/dL (ref 0.61–1.24)
GFR calc Af Amer: >60 mL/min (ref >60)
GFR calc non Af Amer: >60 mL/min (ref >60)
GLUCOSE: 167 mg/dL — AB (ref 65–99)
POTASSIUM: 4.3 mmol/L (ref 3.5–5.1)
SODIUM: 133 mmol/L — AB (ref 135–145)
TOTAL PROTEIN: 6.6 g/dL (ref 6.5–8.1)
Total Bilirubin: 0.4 mg/dL (ref 0.3–1.2)

## 2015-01-04 LAB — CBC
HEMATOCRIT: 46.3 % (ref 39.0–52.0)
HEMOGLOBIN: 16.2 g/dL (ref 13.0–17.0)
MCH: 31.5 pg (ref 26.0–34.0)
MCHC: 35 g/dL (ref 30.0–36.0)
MCV: 90.1 fL (ref 78.0–100.0)
Platelets: 343 10*3/uL (ref 150–400)
RBC: 5.14 MIL/uL (ref 4.22–5.81)
RDW: 14 % (ref 11.5–15.5)
WBC: 22.6 10*3/uL — ABNORMAL HIGH (ref 4.0–10.5)

## 2015-01-04 MED ORDER — FUROSEMIDE 10 MG/ML IJ SOLN
40.0000 mg | Freq: Once | INTRAMUSCULAR | Status: AC
  Administered 2015-01-04: 40 mg via INTRAVENOUS

## 2015-01-04 MED ORDER — HYDROCODONE-ACETAMINOPHEN 5-325 MG PO TABS
1.0000 | ORAL_TABLET | ORAL | Status: DC | PRN
  Administered 2015-01-04 – 2015-01-06 (×6): 1 via ORAL
  Filled 2015-01-04 (×6): qty 1

## 2015-01-04 MED ORDER — IPRATROPIUM-ALBUTEROL 0.5-2.5 (3) MG/3ML IN SOLN
3.0000 mL | RESPIRATORY_TRACT | Status: DC
  Administered 2015-01-04 – 2015-01-06 (×12): 3 mL via RESPIRATORY_TRACT
  Filled 2015-01-04 (×13): qty 3

## 2015-01-04 MED ORDER — LORAZEPAM 0.5 MG PO TABS
0.5000 mg | ORAL_TABLET | Freq: Once | ORAL | Status: AC
  Administered 2015-01-04: 0.5 mg via ORAL
  Filled 2015-01-04: qty 1

## 2015-01-04 MED ORDER — METHYLPREDNISOLONE SODIUM SUCC 125 MG IJ SOLR
80.0000 mg | INTRAMUSCULAR | Status: DC
  Administered 2015-01-04 – 2015-01-05 (×7): 80 mg via INTRAVENOUS
  Filled 2015-01-04 (×6): qty 2

## 2015-01-04 NOTE — Progress Notes (Signed)
Patient complaint of chronic back pain related to history of back surgery. Paged the on-call MD, will follow any new orders received and continue to monitor the patient.

## 2015-01-04 NOTE — Progress Notes (Signed)
Patient states he has some history of anxiety and needs something for his nerves to rest tonight. Paged on-call MD will follow any new orders received and continue to monitor the patient.

## 2015-01-04 NOTE — Care Management Note (Signed)
Case Management Note  Patient Details  Name: Troy Bates MRN: [id number] Date of Birth: Aug 29, 1955  Subjective/Objective:                  Pt admitted from home with COPD exacerbation. Pt lives alone and will return home at discharge. Pt is fairly independent with ADL's. Pt has home O2 with [facility] and neb machine for home use. Pt has a rollator if needed.  Action/Plan: No Cm needs anticipated.  Expected Discharge Date:                  Expected Discharge Plan:  Home/Self Care  In-House Referral:  NA  Discharge planning Services  CM Consult  Post Acute Care Choice:  NA Choice offered to:  NA  DME Arranged:    DME Agency:     HH Arranged:    HH Agency:     Status of Service:  Completed, signed off  Medicare Important Message Given:    Date Medicare IM Given:    Medicare IM give by:    Date Additional Medicare IM Given:    Additional Medicare Important Message give by:     If discussed at Long Length of Stay Meetings, dates discussed:    Additional Comments:  Cheryl Flash, RN 01/04/2015, 12:57 PM

## 2015-01-04 NOTE — Progress Notes (Signed)
[facility] PROGRESS NOTE  GENARO BEKKER ZOX:[id number] DOB: 1955/05/03 DOA: 01/03/2015 PCP: Newman Nip, NP  Assessment/Plan:  Acute on chronic respiratory failure: related to COPD exacerbation secondary to continued cigarrete smoking and non compliance with medical management in setting of morbid obesity. Little improvement this am. Oxygen saturation level 91% on 2L. Air flow remains quite diminished. Will change nebs to scheduled and increase solumedrol slightly. Will give one more dose lasix as having increased LE edema.  Continue antibiotics and monitor closely  Active Problems:   COPD exacerbation: related to continued smoking and some non-compliance. See #1. Home oxygen currently 2L with exertion, qhs and prn. Continue inhalers. Chart review indicates PFT 12/08/2014 FEV1 .99 (30 % ) ratio 50 p 23 % improvement from saba with DLCO 44 % corrects to 77 % for alv volume. See above  LE edema: right greater than left. Denies pain. Will obtain doppler   Leukocytosis: related to steroids. Afebrile and non-toxic. Continue Levaquin day #2.     Hyponatremia: likely related to lasix. Mild. Monitor  Hypertension: controlled. Not on anti-hypertensives at home. Monitor  Tachycardia: related to nebs. Mild. Monitor on tele. Denies chest pain    Sleep apnea: cpap at night.     Tobacco abuse: cessation counseling offered.     Obesity: BMI 33.5       Code Status: full Family Communication: none present Disposition Plan: home when ready   Consultants:  none  Procedures:  none  Antibiotics:  levaquin 01/03/15>>  HPI/Subjective: Sitting on side of bed. Reports very little if any improvement respiratory effort.   Objective: Filed Vitals:   01/04/15 0400  BP: 140/64  Pulse: 105  Temp: 98 F (36.7 C)  Resp: 18    Intake/Output Summary (Last 24 hours) at 01/04/15 0936 Last data filed at 01/03/15 1520  Gross per 24 hour  Intake      0 ml  Output    600 ml  Net    -600 ml   Filed Weights   01/03/15 1242 01/03/15 1741  Weight: 99.791 kg (220 lb) 96.435 kg (212 lb 9.6 oz)    Exam:   General:  Obese sitting on side of bed, mild distress  Cardiovascular: HS distant RRR no MGR 2+ Le edema on right 1+ on left  Respiratory: mild to moderate increased effort with conversation. Very little air movement auscultated  Abdomen: obese tight sluggish BS  Musculoskeletal: no clubbing or erythema   Data Reviewed: Basic Metabolic Panel:  Recent Labs Lab 01/03/15 1317 01/04/15 0541  NA 137 133*  K 4.4 4.3  CL 98* 97*  CO2 32 27  GLUCOSE 111* 167*  BUN 12 16  CREATININE 0.99 1.05  CALCIUM 9.0 8.9   Liver Function Tests:  Recent Labs Lab 01/03/15 1317 01/04/15 0541  AST 17 29  ALT 20 20  ALKPHOS 59 53  BILITOT 0.5 0.4  PROT 6.5 6.6  ALBUMIN 3.5 3.5   No results for input(s): LIPASE, AMYLASE in the last 168 hours. No results for input(s): AMMONIA in the last 168 hours. CBC:  Recent Labs Lab 01/03/15 1317 01/04/15 0541  WBC 14.5* 22.6*  NEUTROABS 10.7*  --   HGB 15.5 16.2  HCT 45.1 46.3  MCV 90.2 90.1  PLT 320 343   Cardiac Enzymes: No results for input(s): CKTOTAL, CKMB, CKMBINDEX, TROPONINI in the last 168 hours. BNP (last 3 results)  Recent Labs  05/18/14 1447 01/03/15 1317  BNP 33.0 17.0    ProBNP (last  3 results) No results for input(s): PROBNP in the last 8760 hours.  CBG: No results for input(s): GLUCAP in the last 168 hours.  No results found for this or any previous visit (from the past 240 hour(s)).   Studies: Dg Chest Portable 1 View  01/03/2015   CLINICAL DATA:  Productive cough, congestion and shortness of breath for 2 weeks.  EXAM: PORTABLE CHEST 1 VIEW  COMPARISON:  CT chest 10/18/2014.  PA and lateral chest 10/27/2014.  FINDINGS: The lungs are emphysematous but clear. No pneumothorax or pleural effusion. Heart size normal.  IMPRESSION: Emphysema without acute disease.   Electronically Signed   By:  Drusilla Kanner M.D.   On: 01/03/2015 13:19    Scheduled Meds: . budesonide-formoterol  2 puff Inhalation BID  . enoxaparin (LOVENOX) injection  40 mg Subcutaneous Q24H  . furosemide  40 mg Intravenous Once  . Influenza vac split quadrivalent PF  0.5 mL Intramuscular Tomorrow-1000  . ipratropium-albuterol  3 mL Nebulization Q4H  . levofloxacin  500 mg Oral Daily  . methylPREDNISolone (SOLU-MEDROL) injection  80 mg Intravenous Q4H  . pneumococcal 23 valent vaccine  0.5 mL Intramuscular Tomorrow-1000  . tiotropium  18 mcg Inhalation Daily   Continuous Infusions:   Principal Problem:     Time spent: 35 mintues    [facility] M  [facility] Pager [phone]. If 7PM-7AM, please contact night-coverage at [email or web address], password [facility] 01/04/2015, 9:36 AM  LOS: 1 day

## 2015-01-05 LAB — BASIC METABOLIC PANEL
Anion gap: 9 (ref 5–15)
BUN: 18 mg/dL (ref 6–20)
CALCIUM: 9.5 mg/dL (ref 8.9–10.3)
CO2: 31 mmol/L (ref 22–32)
Chloride: 96 mmol/L — ABNORMAL LOW (ref 101–111)
Creatinine, Ser: 0.97 mg/dL (ref 0.61–1.24)
GFR calc Af Amer: >60 mL/min (ref >60)
GLUCOSE: 164 mg/dL — AB (ref 65–99)
Potassium: 4.5 mmol/L (ref 3.5–5.1)
Sodium: 136 mmol/L (ref 135–145)

## 2015-01-05 LAB — CBC
HCT: 44.6 % (ref 39.0–52.0)
Hemoglobin: 15.5 g/dL (ref 13.0–17.0)
MCH: 31.2 pg (ref 26.0–34.0)
MCHC: 34.8 g/dL (ref 30.0–36.0)
MCV: 89.7 fL (ref 78.0–100.0)
Platelets: 321 10*3/uL (ref 150–400)
RBC: 4.97 MIL/uL (ref 4.22–5.81)
RDW: 14 % (ref 11.5–15.5)
WBC: 32.6 10*3/uL — ABNORMAL HIGH (ref 4.0–10.5)

## 2015-01-05 MED ORDER — LORAZEPAM 0.5 MG PO TABS
0.5000 mg | ORAL_TABLET | Freq: Four times a day (QID) | ORAL | Status: DC | PRN
  Administered 2015-01-05 – 2015-01-06 (×3): 0.5 mg via ORAL
  Filled 2015-01-05 (×3): qty 1

## 2015-01-05 MED ORDER — FUROSEMIDE 10 MG/ML IJ SOLN
20.0000 mg | Freq: Once | INTRAMUSCULAR | Status: AC
  Administered 2015-01-05: 20 mg via INTRAVENOUS
  Filled 2015-01-05: qty 2

## 2015-01-05 MED ORDER — METHYLPREDNISOLONE SODIUM SUCC 125 MG IJ SOLR
80.0000 mg | Freq: Four times a day (QID) | INTRAMUSCULAR | Status: DC
  Administered 2015-01-05 – 2015-01-06 (×4): 80 mg via INTRAVENOUS
  Filled 2015-01-05 (×4): qty 2

## 2015-01-05 NOTE — Care Management Note (Signed)
Case Management Note  Patient Details  Name: Troy Bates MRN: [id number] Date of Birth: 06-05-1955  Subjective/Objective:                    Action/Plan:   Expected Discharge Date:                  Expected Discharge Plan:  Home/Self Care  In-House Referral:  NA  Discharge planning Services  CM Consult  Post Acute Care Choice:  NA Choice offered to:  NA  DME Arranged:    DME Agency:     HH Arranged:    HH Agency:     Status of Service:  Completed, signed off  Medicare Important Message Given:    Date Medicare IM Given:    Medicare IM give by:    Date Additional Medicare IM Given:    Additional Medicare Important Message give by:     If discussed at Long Length of Stay Meetings, dates discussed:    Additional Comments: Anticipate discharge over the weekend. No CM needs anticipated. Arlyss Queen [place], RN 01/05/2015, 9:06 AM

## 2015-01-05 NOTE — Progress Notes (Signed)
Patient is asleep and has sleep apnea. But is resting without problems.

## 2015-01-05 NOTE — Progress Notes (Signed)
[facility] PROGRESS NOTE  DARSHAWN BOATENG ZOX:[id number] DOB: Nov 09, 1955 DOA: 01/03/2015 PCP: Newman Nip, NP  Assessment/Plan: Acute on chronic respiratory failure: related to COPD exacerbation secondary to continued cigarrete smoking and non compliance with medical management in setting of morbid obesity. Improved this am.  Oxygen saturation level 92% on 3L. Air flow much improved.  Will continue nebs scheduled and decrease solumedrol slightly. Continue antibiotics and flutter valve.  monitor closely  Active Problems:  COPD exacerbation: related to continued smoking and some non-compliance. See #1. Home oxygen currently 2L with exertion, qhs and prn. His new baseline may be at 3L continuous.  Continue inhalers. Chart review indicates PFT 12/08/2014 FEV1 .99 (30 % ) ratio 50 p 23 % improvement from saba with DLCO 44 % corrects to 77 % for alv volume. See above. Will need close OP follow up with pulmonary  LE edema: right greater than left. Denies pain. Doppler negative for DVT. Give one more dose lasix  Leukocytosis:  Tending up. related to steroids.  Remains afebrile and non-toxic. Continue Levaquin day #3.    Hyponatremia: likely related to lasix. Mild. Resolved.  Monitor  Hypertension: controlled. Not on anti-hypertensives at home. Monitor  Tachycardia: related to nebs. Mild. Resolved.  Monitor on tele. Denies chest pain   Sleep apnea: cpap at night.    Tobacco abuse: cessation counseling offered.   Code Status: full Family Communication: patient at bedside Disposition Plan: home likely tomorrow   Consultants:  none  Procedures:  none  Antibiotics:  Levequin 01/03/15>>>  HPI/Subjective: Lying in bed eyes closed. Arouses to verbal stimuli.   Objective: Filed Vitals:   01/05/15 0430  BP: 135/75  Pulse: 90  Temp: 97.9 F (36.6 C)  Resp: 18    Intake/Output Summary (Last 24 hours) at 01/05/15 1020 Last data filed at 01/04/15 1730  Gross per  24 hour  Intake    480 ml  Output      0 ml  Net    480 ml   Filed Weights   01/03/15 1741 01/04/15 1229 01/05/15 0430  Weight: 96.435 kg (212 lb 9.6 oz) 98.431 kg (217 lb) 98.93 kg (218 lb 1.6 oz)    Exam:   General:  Obese appears comfortable   Cardiovascular: RRR no MGR trace-1+ LE edema right greater than left  Respiratory: mild increased work of breathing. Improved air flow but remains distant with diffuse wheeze  Abdomen: obese + BS non-tender to palpation  Musculoskeletal:  No clubbing or cyanosis  Data Reviewed: Basic Metabolic Panel:  Recent Labs Lab 01/03/15 1317 01/04/15 0541 01/05/15 0618  NA 137 133* 136  K 4.4 4.3 4.5  CL 98* 97* 96*  CO2 32 27 31  GLUCOSE 111* 167* 164*  BUN CREATININE 0.99 1.05 0.97  CALCIUM 9.0 8.9 9.5   Liver Function Tests:  Recent Labs Lab 01/03/15 1317 01/04/15 0541  AST 17 29  ALT 20 20  ALKPHOS 59 53  BILITOT 0.5 0.4  PROT 6.5 6.6  ALBUMIN 3.5 3.5   No results for input(s): LIPASE, AMYLASE in the last 168 hours. No results for input(s): AMMONIA in the last 168 hours. CBC:  Recent Labs Lab 01/03/15 1317 01/04/15 0541 01/05/15 0618  WBC 14.5* 22.6* 32.6*  NEUTROABS 10.7*  --   --   HGB 15.5 16.2 15.5  HCT 45.1 46.3 44.6  MCV 90.2 90.1 89.7  PLT 320 343 321   Cardiac Enzymes: No results for input(s): CKTOTAL, CKMB,  CKMBINDEX, TROPONINI in the last 168 hours. BNP (last 3 results)  Recent Labs  05/18/14 1447 01/03/15 1317  BNP 33.0 17.0    ProBNP (last 3 results) No results for input(s): PROBNP in the last 8760 hours.  CBG: No results for input(s): GLUCAP in the last 168 hours.  No results found for this or any previous visit (from the past 240 hour(s)).   Studies: US Venous Img Lower Unilateral Right  01/04/2015   CLINICAL DATA:  Chronic right lower extremity edema.  EXAM: Right LOWER EXTREMITY VENOUS DOPPLER ULTRASOUND  TECHNIQUE: Gray-scale sonography with graded compression, as  well as color Doppler and duplex ultrasound were performed to evaluate the lower extremity deep venous systems from the level of the common femoral vein and including the common femoral, femoral, profunda femoral, popliteal and calf veins including the posterior tibial, peroneal and gastrocnemius veins when visible. The superficial great saphenous vein was also interrogated. Spectral Doppler was utilized to evaluate flow at rest and with distal augmentation maneuvers in the common femoral, femoral and popliteal veins.  COMPARISON:  None.  FINDINGS: Contralateral Common Femoral Vein: Respiratory phasicity is normal and symmetric with the symptomatic side. No evidence of thrombus. Normal compressibility.  Common Femoral Vein: No evidence of thrombus. Normal compressibility, respiratory phasicity and response to augmentation.  Saphenofemoral Junction: No evidence of thrombus. Normal compressibility and flow on color Doppler imaging.  Profunda Femoral Vein: No evidence of thrombus. Normal compressibility and flow on color Doppler imaging.  Femoral Vein: No evidence of thrombus. Normal compressibility, respiratory phasicity and response to augmentation.  Popliteal Vein: No evidence of thrombus. Normal compressibility, respiratory phasicity and response to augmentation.  Calf Veins: No evidence of thrombus. Normal compressibility and flow on color Doppler imaging.  Superficial Great Saphenous Vein: No evidence of thrombus. Normal compressibility and flow on color Doppler imaging.  Venous Reflux:  None.  Other Findings:  None.  IMPRESSION: No evidence of deep venous thrombosis seen in right lower extremity.   Electronically Signed   By: Lupita Raider, M.D.   On: 01/04/2015 11:34   Dg Chest Portable 1 View  01/03/2015   CLINICAL DATA:  Productive cough, congestion and shortness of breath for 2 weeks.  EXAM: PORTABLE CHEST 1 VIEW  COMPARISON:  CT chest 10/18/2014.  PA and lateral chest 10/27/2014.  FINDINGS: The lungs  are emphysematous but clear. No pneumothorax or pleural effusion. Heart size normal.  IMPRESSION: Emphysema without acute disease.   Electronically Signed   By: Drusilla Kanner M.D.   On: 01/03/2015 13:19    Scheduled Meds: . budesonide-formoterol  2 puff Inhalation BID  . enoxaparin (LOVENOX) injection  40 mg Subcutaneous Q24H  . ipratropium-albuterol  3 mL Nebulization Q4H  . levofloxacin  500 mg Oral Daily  . methylPREDNISolone (SOLU-MEDROL) injection  80 mg Intravenous Q6H  . pneumococcal 23 valent vaccine  0.5 mL Intramuscular Tomorrow-1000  . tiotropium  18 mcg Inhalation Daily   Continuous Infusions:   Principal Problem:   Acute on chronic respiratory failure Active Problems:   COPD exacerbation   Cigarette smoker   Obesity   Hypertension   Sleep apnea   Leukocytosis   Hyponatremia   Tobacco abuse    Time spent: 35 minutes    [facility] M  [facility] Pager [phone]. If 7PM-7AM, please contact night-coverage at [email or web address], password [facility] 01/05/2015, 10:20 AM  LOS: 2 days

## 2015-01-06 DIAGNOSIS — I10 Essential (primary) hypertension: Secondary | ICD-10-CM

## 2015-01-06 DIAGNOSIS — E669 Obesity, unspecified: Secondary | ICD-10-CM

## 2015-01-06 DIAGNOSIS — J441 Chronic obstructive pulmonary disease with (acute) exacerbation: Principal | ICD-10-CM

## 2015-01-06 LAB — BASIC METABOLIC PANEL
ANION GAP: 6 (ref 5–15)
BUN: 21 mg/dL — ABNORMAL HIGH (ref 6–20)
CALCIUM: 8.6 mg/dL — AB (ref 8.9–10.3)
CO2: 30 mmol/L (ref 22–32)
Chloride: 97 mmol/L — ABNORMAL LOW (ref 101–111)
Creatinine, Ser: 0.87 mg/dL (ref 0.61–1.24)
Glucose, Bld: 134 mg/dL — ABNORMAL HIGH (ref 65–99)
Potassium: 4.4 mmol/L (ref 3.5–5.1)
Sodium: 133 mmol/L — ABNORMAL LOW (ref 135–145)

## 2015-01-06 LAB — CBC
HCT: 45.4 % (ref 39.0–52.0)
HEMOGLOBIN: 16.2 g/dL (ref 13.0–17.0)
MCH: 32 pg (ref 26.0–34.0)
MCHC: 35.7 g/dL (ref 30.0–36.0)
MCV: 89.5 fL (ref 78.0–100.0)
PLATELETS: 331 10*3/uL (ref 150–400)
RBC: 5.07 MIL/uL (ref 4.22–5.81)
RDW: 14.2 % (ref 11.5–15.5)
WBC: 28.8 10*3/uL — ABNORMAL HIGH (ref 4.0–10.5)

## 2015-01-06 MED ORDER — LEVOFLOXACIN 500 MG PO TABS: 500.0000 mg | ORAL_TABLET | Freq: Every day | ORAL | Status: DC

## 2015-01-06 MED ORDER — HYDROCODONE-ACETAMINOPHEN 5-325 MG PO TABS: 1.0000 | ORAL_TABLET | ORAL | Status: DC | PRN

## 2015-01-06 MED ORDER — PREDNISONE 20 MG PO TABS: 20.0000 mg | ORAL_TABLET | Freq: Two times a day (BID) | ORAL | Status: DC

## 2015-01-06 MED ORDER — LORAZEPAM 0.5 MG PO TABS: 0.5000 mg | ORAL_TABLET | Freq: Four times a day (QID) | ORAL | Status: DC | PRN

## 2015-01-06 NOTE — Discharge Summary (Signed)
Physician Discharge Summary  ELIC VENCILL NWG:[id number] DOB: Jun 26, 1955 DOA: 01/03/2015  PCP: Newman Nip, NP  Admit date: 01/03/2015 Discharge date: 01/06/2015  Time spent: 35 minutes  Recommendations for Outpatient Follow-up:  1. Follow up with your PCP in one week.    Discharge Diagnoses:  Principal Problem:   Acute on chronic respiratory failure Active Problems:   COPD exacerbation   Cigarette smoker   Obesity   Hypertension   Sleep apnea   Leukocytosis   Hyponatremia   Tobacco abuse   Discharge Condition: Improved.  Diet recommendation: Cardiac diet.  Filed Weights   01/04/15 1229 01/05/15 0430 01/06/15 0424  Weight: 98.431 kg (217 lb) 98.93 kg (218 lb 1.6 oz) 99.791 kg (220 lb)    History of present illness:  Patient was admitted by dr Karilyn Cota for SOB on Sept 28, 2016.  As per his H and P:  "  Troy Bates is a 59 y.o. male  This is a 59 year old man, long-standing smoker who apparently quit smoking couple of months ago, who presents with a two-week history of dyspnea associated with a cough productive of yellow sputum. He denies any fever. He denies any chest pain, palpitations or limb weakness. When he arrived in the emergency room, he appeared to be in significant respiratory distress and was put on BiPAP. However, after bronchodilating, he has improved and is now requiring nasal cannula oxygen. He is normally on 2 L oxygen per minute at home. He is not on oral steroids chronically. He is now being admitted for further management.    Hospital Course: Patient was admitted into the hospital for COPD exacerbation.  He was given Nebs, along with IV Levaquin and IV steroids.  He was slow to respond, but did improve.  He was kept in the hospital as he was having wheezing.  He has oxygen at home, and uses his CPAP hs.  He was able to ambulate without having any desat.  He is anxious to go home, and thinks that he is now at his baseline.  He is stable for  discharge, and will be discharged today.  I will give him Prednisone  BID for one week to take with food.  He will continue another 5 days of oral Levaquin, and he will continue with his home meds.  He will follow up with his PCP in one week.  Thank you and Good Day.   Discharge Exam: Filed Vitals:   01/06/15 0535  BP: 130/72  Pulse: 93  Temp: 98.1 F (36.7 C)  Resp: 20    Discharge Instructions   Discharge Instructions    Diet - low sodium heart healthy    Complete by:  As directed      Discharge instructions    Complete by:  As directed   Follow up with your PCP In one week.  Avoid exposure to second hand smoke.  Take your meds as directed.     Increase activity slowly    Complete by:  As directed           Current Discharge Medication List    START taking these medications   Details  HYDROcodone-acetaminophen (NORCO/VICODIN) 5-325 MG tablet Take 1 tablet by mouth every 4 (four) hours as needed for moderate pain. Qty: 20 tablet, Refills: 0    !! levofloxacin (LEVAQUIN) 500 MG tablet Take 1 tablet (500 mg total) by mouth daily. Qty: 5 tablet, Refills: 0    !! levofloxacin (LEVAQUIN) 500 MG tablet Take  1 tablet (500 mg total) by mouth daily. Qty: 7 tablet, Refills: 0    LORazepam (ATIVAN) 0.5 MG tablet Take 1 tablet (0.5 mg total) by mouth every 6 (six) hours as needed for anxiety or sleep. Qty: 20 tablet, Refills: 0    predniSONE (DELTASONE) 20 MG tablet Take 1 tablet (20 mg total) by mouth 2 (two) times daily with a meal. Qty: 14 tablet, Refills: 0     !! - Potential duplicate medications found. Please discuss with provider.    CONTINUE these medications which have NOT CHANGED   Details  Aclidinium Bromide (TUDORZA PRESSAIR) 400 MCG/ACT AEPB Inhale 1 puff into the lungs 2 (two) times daily. One twice daily Qty: 1 each, Refills: 11    albuterol (PROVENTIL) (2.5 MG/3ML) 0.083% nebulizer solution Take 3 mLs (2.5 mg total) by nebulization every 2 (two) hours as  needed for wheezing. Qty: 75 mL, Refills: 2    budesonide-formoterol (SYMBICORT) 160-4.5 MCG/ACT inhaler Inhale 2 puffs into the lungs 2 (two) times daily. Qty: 1 Inhaler, Refills: 12    albuterol (PROVENTIL HFA;VENTOLIN HFA) 108 (90 BASE) MCG/ACT inhaler Inhale 2 puffs into the lungs every 6 (six) hours as needed for wheezing or shortness of breath.      STOP taking these medications     dextromethorphan-guaiFENesin (MUCINEX DM) 30-600 MG per 12 hr tablet      tiotropium (SPIRIVA) 18 MCG inhalation capsule        Allergies  Allergen Reactions  . Sudafed [Pseudoephedrine Hcl] Rash   Follow-up Information    Follow up with CLAYTON, MONICA, NP.   Specialty:  Nurse Practitioner   Contact information:   [address] [place] [address] [phone]       Follow up with Sandrea Hughs, MD.   Specialty:  Pulmonary Disease   Contact information:   [address] [place] [address] [phone]        The results of significant diagnostics from this hospitalization (including imaging, microbiology, ancillary and laboratory) are listed below for reference.    Significant Diagnostic Studies: US Venous Img Lower Unilateral Right  01/04/2015   CLINICAL DATA:  Chronic right lower extremity edema.  EXAM: Right LOWER EXTREMITY VENOUS DOPPLER ULTRASOUND  TECHNIQUE: Gray-scale sonography with graded compression, as well as color Doppler and duplex ultrasound were performed to evaluate the lower extremity deep venous systems from the level of the common femoral vein and including the common femoral, femoral, profunda femoral, popliteal and calf veins including the posterior tibial, peroneal and gastrocnemius veins when visible. The superficial great saphenous vein was also interrogated. Spectral Doppler was utilized to evaluate flow at rest and with distal augmentation maneuvers in the common femoral, femoral and popliteal veins.  COMPARISON:  None.  FINDINGS: Contralateral Common  Femoral Vein: Respiratory phasicity is normal and symmetric with the symptomatic side. No evidence of thrombus. Normal compressibility.  Common Femoral Vein: No evidence of thrombus. Normal compressibility, respiratory phasicity and response to augmentation.  Saphenofemoral Junction: No evidence of thrombus. Normal compressibility and flow on color Doppler imaging.  Profunda Femoral Vein: No evidence of thrombus. Normal compressibility and flow on color Doppler imaging.  Femoral Vein: No evidence of thrombus. Normal compressibility, respiratory phasicity and response to augmentation.  Popliteal Vein: No evidence of thrombus. Normal compressibility, respiratory phasicity and response to augmentation.  Calf Veins: No evidence of thrombus. Normal compressibility and flow on color Doppler imaging.  Superficial Great Saphenous Vein: No evidence of thrombus. Normal compressibility and  flow on color Doppler imaging.  Venous Reflux:  None.  Other Findings:  None.  IMPRESSION: No evidence of deep venous thrombosis seen in right lower extremity.   Electronically Signed   By: Lupita Raider, M.D.   On: 01/04/2015 11:34   Dg Chest Portable 1 View  01/03/2015   CLINICAL DATA:  Productive cough, congestion and shortness of breath for 2 weeks.  EXAM: PORTABLE CHEST 1 VIEW  COMPARISON:  CT chest 10/18/2014.  PA and lateral chest 10/27/2014.  FINDINGS: The lungs are emphysematous but clear. No pneumothorax or pleural effusion. Heart size normal.  IMPRESSION: Emphysema without acute disease.   Electronically Signed   By: Drusilla Kanner M.D.   On: 01/03/2015 13:19    Microbiology: No results found for this or any previous visit (from the past 240 hour(s)).   Labs: Basic Metabolic Panel:  Recent Labs Lab 01/03/15 1317 01/04/15 0541 01/05/15 0618 01/06/15 0617  NA 137 133* 136 133*  K 4.4 4.3 4.5 4.4  CL 98* 97* 96* 97*  CO2 32 GLUCOSE 111* 167* 164* 134*  BUN 21*  CREATININE 0.99 1.05 0.97  0.87  CALCIUM 9.0 8.9 9.5 8.6*   Liver Function Tests:  Recent Labs Lab 01/03/15 1317 01/04/15 0541  AST 17 29  ALT 20 20  ALKPHOS 59 53  BILITOT 0.5 0.4  PROT 6.5 6.6  ALBUMIN 3.5 3.5   CBC:  Recent Labs Lab 01/03/15 1317 01/04/15 0541 01/05/15 0618 01/06/15 0617  WBC 14.5* 22.6* 32.6* 28.8*  NEUTROABS 10.7*  --   --   --   HGB 15.5 16.2 15.5 16.2  HCT 45.1 46.3 44.6 45.4  MCV 90.2 90.1 89.7 89.5  PLT 320 343 321 331   BNP (last 3 results)  Recent Labs  05/18/14 1447 01/03/15 1317  BNP 33.0 17.0   Signed:  Menucha Dicesare  [facility] 01/06/2015, 10:17 AM

## 2015-01-06 NOTE — Progress Notes (Signed)
Pt IV removed, pt tolerated well.  Reviewed discharge instructions with pt and answered all questions at this time.  Will continue to monitor until pt leaves the floor.

## 2015-01-19 ENCOUNTER — Encounter: Payer: Self-pay | Admitting: Internal Medicine

## 2015-01-19 ENCOUNTER — Ambulatory Visit (INDEPENDENT_AMBULATORY_CARE_PROVIDER_SITE_OTHER): Payer: Medicaid Other | Admitting: Internal Medicine

## 2015-01-19 VITALS — BP 126/78 | HR 102 | Ht 66.0 in | Wt 225.0 lb

## 2015-01-19 DIAGNOSIS — J449 Chronic obstructive pulmonary disease, unspecified: Secondary | ICD-10-CM | POA: Diagnosis not present

## 2015-01-19 DIAGNOSIS — E669 Obesity, unspecified: Secondary | ICD-10-CM | POA: Diagnosis not present

## 2015-01-19 DIAGNOSIS — J9611 Chronic respiratory failure with hypoxia: Secondary | ICD-10-CM | POA: Diagnosis not present

## 2015-01-19 DIAGNOSIS — G4730 Sleep apnea, unspecified: Secondary | ICD-10-CM

## 2015-01-19 DIAGNOSIS — M7989 Other specified soft tissue disorders: Secondary | ICD-10-CM

## 2015-01-19 MED ORDER — PANTOPRAZOLE SODIUM 40 MG PO TBEC: 40.0000 mg | DELAYED_RELEASE_TABLET | Freq: Every day | ORAL | Status: AC

## 2015-01-19 MED ORDER — FAMOTIDINE 20 MG PO TABS: ORAL_TABLET | ORAL | Status: AC

## 2015-01-19 MED ORDER — TRIAMTERENE-HCTZ 37.5-25 MG PO TABS: 1.0000 | ORAL_TABLET | Freq: Every day | ORAL | Status: AC

## 2015-01-19 NOTE — Patient Instructions (Addendum)
Please see patient coordinator before you leave today  to schedule split night sleep study   Add maxzide 25 one daily to control leg swelling   Work on inhaler technique:  relax and gently blow all the way out then take a nice smooth deep breath back in, triggering the inhaler at same time you start breathing in.  Hold for up to 5 seconds if you can. Blow out thru nose. Rinse and gargle with water when done    Plan A = Automatic = symbicort/ tudorza plus Pantoprazole (protonix) 40 mg   Take  30-60 min before first meal of the day and Pepcid (famotidine)  20 mg one @  bedtime until return to office - this is the best way to tell whether stomach acid is contributing to your problem.    Plan B= Back up - Only use your albuterol (proair) as a rescue medication to be used if you can't catch your breath by resting or doing a relaxed purse lip breathing pattern.  - The less you use it, the better it will work when you need it. - Ok to use up to 2 puffs  every 4 hours if you must but call for immediate appointment if use goes up over your usual need - Don't leave home without it !!  (think of it like the spare tire for your car)   Plan C = crisis - only use the neb if you try the proair  First and it fails to work   GERD (REFLUX)  is an extremely common cause of respiratory symptoms just like yours , many times with no obvious heartburn at all.    It can be treated with medication, but also with lifestyle changes including elevation of the head of your bed (ideally with 6 inch  bed blocks),  Smoking cessation, avoidance of late meals, excessive alcohol, and avoid fatty foods, chocolate, peppermint, colas, red wine, and acidic juices such as orange juice.  NO MINT OR MENTHOL PRODUCTS SO NO COUGH DROPS  USE SUGARLESS CANDY INSTEAD (Jolley ranchers or Stover's or Life Savers) or even ice chips will also do - the key is to swallow to prevent all throat clearing. NO OIL BASED VITAMINS - use powdered  substitutes.    Please schedule a follow up office visit in 6 weeks, call sooner if needed with cxr on return and prevnar

## 2015-01-19 NOTE — Progress Notes (Signed)
Subjective:     Patient ID: Troy Bates, male   DOB: [birth year]    MRN: [id number]   Brief patient profile:  59 yowm obese smoker with new onset doe x 2014 placed on 02 after admit to [facility]:   Admit date: 05/18/2014 Discharge date: 05/20/2014  Time spent: 45 minutes  Recommendations for Outpatient Follow-up:  -Will be discharged home today. -Advised to follow up with PCP in 2 weeks.  Discharge Diagnoses:  Active Problems:  COPD exacerbation  Acute on chronic respiratory failure  Tobacco abuse   Discharge Condition: Stable and improved  Filed Weights   05/18/14 1430  Weight: 86.183 kg (190 lb)    History of present illness:  This is a 59 year old man who has a history of COPD and continues to smoke half a pack of cigarettes a day now presents with 3-4 day history of increasing shortness of breath associated with a cough productive of green/brown sputum. He is usually oxygen dependent and has 2 L/m oxygen at home. He denies any chest pain, palpitations, fever, nausea or vomiting. Treatment in the emergency room has failed to improve him to a level that he feels he can go home. He is now being admitted for treatment of his exacerbation of COPD.   Hospital Course:   Acute on Chronic Respiratory failure -2/2 COPD. -See below for details.  COPD with Acute Exacerbation -Not optimally medicated at home. -Add symbicort/spiriva and PRN albuterol MDI. -Continue steroid taper.      10/27/2014 1st [facility] office visit/ Beatriz Settles  / 02 2lpm 24/7 but still smoking "chantix too expensive" Chief Complaint  Patient presents with  . Advice Only    COPD; RLL Lesion on CT Chest; pain in chest when cough or sneeze; SOB most of time; chest tightness; prod cough w/green mucus   at his best still wears 02 2lpm 24/7 and can do some shopping like at [facility] / uses [facility] parking / can't do [employer]  Worse than usual since 3-4 weeks prior to OV   With abrupt worsening cough >  variably productive of bloody mucus but usually green less so finishing up 9/10 days levaquin with ct suggestive of cavitary pna RLL 10/18/14  No pain on deep breathing  Already took am symb/spiriva but very confused with use of saba in various forms  rec Please remember to go to the  x-ray department downstairs for your tests - we will call you with the results when they are available. Plan A = automatic = symbicort /spiriva Plan B = backup Only use your albuterol as a rescue medication to be used if you can't catch your breath  Plan C = crisis  Only use albuterol  2.5 up to every 4 hours if you must  office visit in 4 weeks, sooner if needed with pfts and all medications   12/08/2014 f/u ov/Cornell Bourbon re: GOLD III/IV min reversibility/ still smoking  Chief Complaint  Patient presents with  . Follow-up    PFT done today. Pt states that his breathing seems some better. He states he has been bloated and does get SOB when he eats.    Walking to [facility] on 02 2lpm and oob on return but doesn't need to stop slow pace  rec The key is to stop smoking completely before smoking completely stops you!  Try tudorza one twice daily instead of spiriva and if you like it fill it - late add needs f/u cxr/ placed in tickle    Admit  date: 01/03/2015 Discharge date: 01/06/2015  Recommendations for Outpatient Follow-up:  1. Follow up with your PCP in one week.   Discharge Diagnoses:  Principal Problem:  Acute on chronic respiratory failure Active Problems:  COPD exacerbation  Cigarette smoker  Obesity  Hypertension  Sleep apnea  Leukocytosis  Hyponatremia  Tobacco abuse   Discharge Condition: Improved.  Diet recommendation: Cardiac diet.  Filed Weights   01/04/15 1229 01/05/15 0430 01/06/15 0424  Weight: 98.431 kg (217 lb) 98.93 kg (218 lb 1.6 oz) 99.791 kg (220 lb)    History of present illness: Patient was admitted by dr Karilyn CotaGosrani for SOB on Sept 28, 2016. As per his H and  P: " Troy MeiersRobert R Bates is a 59 y.o. male  This is a 59 year old man, long-standing smoker who apparently quit smoking couple of months ago, who presents with a two-week history of dyspnea associated with a cough productive of yellow sputum. He denies any fever. He denies any chest pain, palpitations or limb weakness. When he arrived in the emergency room, he appeared to be in significant respiratory distress and was put on BiPAP. However, after bronchodilating, he has improved and is now requiring nasal cannula oxygen. He is normally on 2 L oxygen per minute at home. He is not on oral steroids chronically. He is now being admitted for further management.    Hospital Course: Patient was admitted into the hospital for COPD exacerbation. He was given Nebs, along with IV Levaquin and IV steroids. He was slow to respond, but did improve. He was kept in the hospital as he was having wheezing. He has oxygen at home, and uses his CPAP hs. He was able to ambulate without having any desat. He is anxious to go home, and thinks that he is now at his baseline. He is stable for discharge, and will be discharged today. I will give him Prednisone 20mg  BID for one week to take with food. He will continue another 5 days of oral Levaquin, and he will continue with his home meds. He will follow up with his PCP in one week.        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 Follow-up    Pt states had to be hospitalized with COPD flare from 01/03/15-01/06/15. His breathing has not improved much since then. He gets out of breath walking just from room to room at home. He is using albuterol inhaler 2 x per day and uses neb with albuterol 2 x per wk.      No longer has cpap/ worse HB x sev weeks   No obvious patterns in day to day or daytime variability or assoc cough or   cp or chest tightness, subjective wheeze or overt sinus   symptoms. No unusual  exp hx or h/o childhood pna/ asthma or knowledge of premature birth.  Sleeping ok without nocturnal  or early am exacerbation  of respiratory  c/o's or need for noct saba. Also denies any obvious fluctuation of symptoms with weather or environmental changes or other aggravating or alleviating factors except as outlined above   Current Medications, Allergies, Complete Past Medical History, Past Surgical History, Family History, and Social History were reviewed in [facility] electronic medical record.  ROS  The following are not active complaints unless bolded sore throat, dysphagia, dental problems, itching, sneezing,  nasal congestion or excess/ purulent secretions, ear ache,   fever, chills, sweats, unintended  wt loss, classically pleuritic or exertional cp, hemoptysis,  orthopnea pnd or leg swelling, presyncope, palpitations, abdominal pain anorexia, nausea, vomiting, diarrhea  or change in bowel or bladder habits, change in stools or urine, dysuria,hematuria,  rash, arthralgias, visual complaints, headache, numbness, weakness or ataxia or problems with walking or coordination,  change in mood/affect or memory.           Objective:   Physical Exam    amb obese wm nad     12/08/2014           213  > 01/19/2015    225  Wt Readings from Last 3 Encounters:  10/27/14 211 lb (95.709 kg)  05/28/14 190 lb (86.183 kg)  05/18/14 190 lb (86.183 kg)    Vital signs reviewed   HEENT: nl dentition, turbinates, and orophanx. Nl external ear canals without cough reflex   NECK :  without JVD/Nodes/TM/ nl carotid upstrokes bilaterally   LUNGS: no acc muscle use, distant bs bilaterally with end exp wheeze    CV:  RRR  no s3 or murmur or increase in P2,  1-2+ bilateral lower ext pitting edema   ABD:  Tensely obese  nontender with nl excursion in the supine position. No bruits or organomegaly, bowel sounds nl  MS:  warm without deformities, calf tenderness, cyanosis or clubbing  SKIN: warm and  dry without lesions    NEURO:  alert, approp, no deficits    I personally reviewed images and agree with radiology impression as follows:  CXR:  01/03/15 Emphysema without acute disease.   Labs  reviewed:     Chemistry      Component Value Date/Time   NA 133* 01/06/2015 0617   K 4.4 01/06/2015 0617   CL 97* 01/06/2015 0617   CO2 30 01/06/2015 0617   BUN 21* 01/06/2015 0617   CREATININE 0.87 01/06/2015 0617      Component Value Date/Time   CALCIUM 8.6* 01/06/2015 0617   ALKPHOS 53 01/04/2015 0541   AST 29 01/04/2015 0541   ALT 20 01/04/2015 0541   BILITOT 0.4 01/04/2015 0541        Lab Results  Component Value Date   WBC 28.8* 01/06/2015   HGB 16.2 01/06/2015   HCT 45.4 01/06/2015   MCV 89.5 01/06/2015   PLT 331 01/06/2015     BNP   01/03/15  = 17            Assessment:

## 2015-01-20 ENCOUNTER — Encounter: Payer: Self-pay | Admitting: Internal Medicine

## 2015-01-20 DIAGNOSIS — M7989 Other specified soft tissue disorders: Secondary | ICD-10-CM | POA: Insufficient documentation

## 2015-01-20 NOTE — Assessment & Plan Note (Signed)
No longer has cpap, needs split night study and f/u with sleep doc here later

## 2015-01-20 NOTE — Assessment & Plan Note (Signed)
02 2lpm since [facility] admit 06/2014  - RA sat 12/08/2014  = 97% - 12/08/2014   Walked 2lpm x one lap @ 185 stopped due to  Sob/ slow pace, no desat   rec as of 12/08/2014 :  02 2lpm  with exertion and sleep and prn otherwise   nees split night sleep study since no longer has cpap certification

## 2015-01-20 NOTE — Assessment & Plan Note (Signed)
May be devloping cor pulmonale > for now just rx with maxzide 25

## 2015-01-20 NOTE — Assessment & Plan Note (Signed)
Body mass index is 36.33 trending up   No results found for: TSH   Contributing to gerd tendency/ doe/reviewed the need and the process to achieve and maintain neg calorie balance > defer f/u primary care including intermittently monitoring thyroid status

## 2015-01-20 NOTE — Assessment & Plan Note (Signed)
-   10/27/2014 p extensive coaching HFA effectiveness =    75% from a baseline of 25%  - PFT's  12/08/2014  FEV1 .99 (30 % ) ratio 50  p 23 % improvement from saba with DLCO  44 % corrects to 77 % for alv volume    Very severe copd at baseline and struggling with concept of maint vs prns  The proper method of use, as well as anticipated side effects, of a metered-dose inhaler are discussed and demonstrated to the patient. Improved effectiveness after extensive coaching during this visit to a level of approximately  75% so ok to continue symbicort/ tudorza as maint rx  I had an extended discussion with the patient reviewing all relevant studies completed to date and  lasting 15 to 20 minutes of a 25 minute visit    Each maintenance medication was reviewed in detail including most importantly the difference between maintenance and prns and under what circumstances the prns are to be triggered using an action plan format that is not reflected in the computer generated alphabetically organized AVS.    Please see instructions for details which were reviewed in writing and the patient given a copy highlighting the part that I personally wrote and discussed at today's ov.

## 2015-02-14 ENCOUNTER — Ambulatory Visit (HOSPITAL_BASED_OUTPATIENT_CLINIC_OR_DEPARTMENT_OTHER): Payer: Medicaid Other | Attending: Internal Medicine | Admitting: Radiology

## 2015-02-14 DIAGNOSIS — G4733 Obstructive sleep apnea (adult) (pediatric): Secondary | ICD-10-CM | POA: Insufficient documentation

## 2015-02-14 DIAGNOSIS — Z6835 Body mass index (BMI) 35.0-35.9, adult: Secondary | ICD-10-CM | POA: Diagnosis not present

## 2015-02-14 DIAGNOSIS — R5383 Other fatigue: Secondary | ICD-10-CM | POA: Insufficient documentation

## 2015-02-14 DIAGNOSIS — E669 Obesity, unspecified: Secondary | ICD-10-CM | POA: Diagnosis not present

## 2015-02-14 DIAGNOSIS — G4719 Other hypersomnia: Secondary | ICD-10-CM | POA: Insufficient documentation

## 2015-02-14 DIAGNOSIS — I10 Essential (primary) hypertension: Secondary | ICD-10-CM | POA: Insufficient documentation

## 2015-02-14 DIAGNOSIS — J9610 Chronic respiratory failure, unspecified whether with hypoxia or hypercapnia: Secondary | ICD-10-CM | POA: Diagnosis not present

## 2015-02-14 DIAGNOSIS — G4730 Sleep apnea, unspecified: Secondary | ICD-10-CM

## 2015-02-19 ENCOUNTER — Other Ambulatory Visit: Payer: Self-pay | Admitting: Internal Medicine

## 2015-02-19 ENCOUNTER — Telehealth: Payer: Self-pay | Admitting: Internal Medicine

## 2015-02-19 DIAGNOSIS — G4730 Sleep apnea, unspecified: Secondary | ICD-10-CM

## 2015-02-19 DIAGNOSIS — G4733 Obstructive sleep apnea (adult) (pediatric): Secondary | ICD-10-CM | POA: Diagnosis not present

## 2015-02-19 MED ORDER — PREDNISONE 10 MG PO TABS: ORAL_TABLET | ORAL | Status: DC

## 2015-02-19 NOTE — Progress Notes (Signed)
Patient Name: Troy Bates, Troy Bates Date: 02/14/2015 Gender: Male D.O.B: 23-Oct-1955 Age (years): 59 Referring Provider: Tanda Rockers Height (inches): 72 Interpreting Physician: Chesley Mires MD, ABSM Weight (lbs): 228 RPSGT: Carolin Coy BMI: 35 MRN: [id number] Neck Size: 20.00  CLINICAL INFORMATION Sleep Study Type: Split Night CPAP Indication for sleep study: COPD, Excessive Daytime Sleepiness, Fatigue, Hypertension, Obesity, Snoring, Witnessed Apneas Epworth Sleepiness Score: 15  SLEEP STUDY TECHNIQUE As per the AASM Manual for the Scoring of Sleep and Associated Events v2.3 (April 2016) with a hypopnea requiring 4% desaturations. The channels recorded and monitored were frontal, central and occipital EEG, electrooculogram (EOG), submentalis EMG (chin), nasal and oral airflow, thoracic and abdominal wall motion, anterior tibialis EMG, snore microphone, electrocardiogram, and pulse oximetry. Continuous positive airway pressure (CPAP) was initiated when the patient met split night criteria and was titrated according to treat sleep-disordered breathing.  MEDICATIONS Medications taken by the patient : reviewed in electronic medical record. Medications administered by patient during sleep study : No sleep medicine administered.  RESPIRATORY PARAMETERS Diagnostic Total AHI (/hr): 18.8 RDI (/hr): 32.3 OA Index (/hr): 0.6 CA Index (/hr): 0.0 REM AHI (/hr): 54.9 NREM AHI (/hr): 15.4 Supine AHI (/hr): 7.1 Non-supine AHI (/hr): 19.30 Min O2 Sat (%): 86.00 Mean O2 (%): 93.98 Time below 88% (min): 1.2      Titration Optimal Pressure (cm): 15 AHI at Optimal Pressure (/hr): 0.0 Min O2 at Optimal Pressure (%): 88.0  Study conducted with patient using 2 liters oxygen.   SLEEP ARCHITECTURE The recording time for the entire night was 385.0 minutes. During a baseline period of 215.9 minutes, the patient slept for 204.4 minutes in REM and nonREM, yielding a sleep efficiency of 94.7%. Sleep  onset after lights out was 0.5 minutes with a REM latency of 159.5 minutes. The patient spent 14.92% of the night in stage N1 sleep, 76.52% in stage N2 sleep, 0.00% in stage N3 and 8.56% in REM. During the titration period of 166.7 minutes, the patient slept for 131.6 minutes in REM and nonREM, yielding a sleep efficiency of 79.0%. Sleep onset after CPAP initiation was 0.0 minutes with a REM latency of 96.6 minutes. The patient spent 26.60% of the night in stage N1 sleep, 26.28% in stage N2 sleep, 0.00% in stage N3 and 47.12% in REM.  CARDIAC DATA The 2 lead EKG demonstrated sinus rhythm. The mean heart rate was 100.21 beats per minute. Other EKG findings include: None.  LEG MOVEMENT DATA The total Periodic Limb Movements of Sleep (PLMS) were 0. The PLMS index was 0.00 .  IMPRESSIONS Moderate obstructive sleep apnea with an AHI of 18.8 and SaO2 low of 86%.  He did well with CPAP 15 cm H2O.  Study was done with him using 2 liters oxygen.  DIAGNOSIS - Obstructive Sleep Apnea (G47.33) - Chronic respiratory failure (J96.10)  RECOMMENDATIONS - In addition to weight loss, he should be started on CPAP 15 cm H2O with 2 liters oxygen.   Chesley Mires, MD, [place], American Board of Sleep Medicine 02/19/2015, 5:14 PM  NPI: [id number]

## 2015-02-19 NOTE — Telephone Encounter (Signed)
Called and spoke to pt. Informed him of the recs per MW. Rx sent to preferred pharmacy. Pt verbalized understanding and denied any further questions or concerns at this time.   

## 2015-02-19 NOTE — Telephone Encounter (Signed)
Patient wants to know if Dr. Sherene SiresWert can send in a prescription for some prednisone.  Patient is having a lot of SOB, does not want to go to hospital.  Wears 2L continuous, chest is very tight.  [facility]  Allergies  Allergen Reactions  . Sudafed [Pseudoephedrine Hcl] Rash

## 2015-02-19 NOTE — Telephone Encounter (Signed)
Prednisone 10 mg take  4 each am x 2 days,   2 each am x 2 days,  1 each am x 2 days and stop   Be sure has f/u with all meds in hand

## 2015-03-06 ENCOUNTER — Ambulatory Visit: Payer: Medicaid Other | Admitting: Internal Medicine

## 2015-03-12 ENCOUNTER — Telehealth: Payer: Self-pay | Admitting: Internal Medicine

## 2015-03-12 NOTE — Telephone Encounter (Signed)
That's fine

## 2015-03-12 NOTE — Telephone Encounter (Signed)
Called spoke with Marisue IvanLiz and gave VO. Nothing further needed

## 2015-03-12 NOTE — Telephone Encounter (Signed)
[facility]. [facility] [place] and they currently no longer have any providers there to see pt. So pt currently does not have a PCP. Marisue IvanLiz is wanting to know if MW would sign for home health and pt needs OT as well. Please advise thanks

## 2015-03-14 ENCOUNTER — Telehealth: Payer: Self-pay | Admitting: Internal Medicine

## 2015-03-14 NOTE — Telephone Encounter (Signed)
Last ov instructions  Add maxzide 25 one daily to control leg swelling  F/u 6 weeks   Not clear he did either, I did not prescribe lasix  Best option is ov with all meds in hand asap, ok to add on

## 2015-03-14 NOTE — Telephone Encounter (Signed)
Spoke with pt, states that takes 40mg  lasix qd.  [facility]    MW are you ok with filling this med for pt?  Thanks!

## 2015-03-14 NOTE — Telephone Encounter (Signed)
Called spoke with pt. He reports he did start the maxzide OV scheduled for Friday at 1:30 with MW and aware to bring all meds. Nothing further needed

## 2015-03-14 NOTE — Telephone Encounter (Signed)
Called spoke with pt. He is needing refill on lasix. This is not on current med list. He is going to check and call us back to see the dosage he is on.  Will await call

## 2015-03-16 ENCOUNTER — Ambulatory Visit (INDEPENDENT_AMBULATORY_CARE_PROVIDER_SITE_OTHER)
Admission: RE | Admit: 2015-03-16 | Discharge: 2015-03-16 | Disposition: A | Discharge status: Home / Self Care | Payer: Medicaid Other | Source: Ambulatory Visit | Attending: Internal Medicine | Admitting: Internal Medicine

## 2015-03-16 ENCOUNTER — Ambulatory Visit (INDEPENDENT_AMBULATORY_CARE_PROVIDER_SITE_OTHER): Payer: Medicaid Other | Admitting: Internal Medicine

## 2015-03-16 ENCOUNTER — Encounter: Payer: Self-pay | Admitting: Internal Medicine

## 2015-03-16 ENCOUNTER — Telehealth: Payer: Self-pay | Admitting: Internal Medicine

## 2015-03-16 VITALS — BP 120/80 | HR 112 | Ht 67.0 in | Wt 203.0 lb

## 2015-03-16 DIAGNOSIS — J9611 Chronic respiratory failure with hypoxia: Secondary | ICD-10-CM | POA: Diagnosis not present

## 2015-03-16 DIAGNOSIS — J449 Chronic obstructive pulmonary disease, unspecified: Secondary | ICD-10-CM | POA: Diagnosis not present

## 2015-03-16 DIAGNOSIS — Z23 Encounter for immunization: Secondary | ICD-10-CM

## 2015-03-16 MED ORDER — FUROSEMIDE 40 MG PO TABS: ORAL_TABLET | ORAL | Status: DC

## 2015-03-16 NOTE — Patient Instructions (Addendum)
Add lasix 40 mg daily whenever your legs are swollen (taken as needed )   prevnar 13 today   For cough > mucinex or mucinex dm up to 1200 mg every 12 hours and the flutter valve as much as you can   Please see patient coordinator before you leave today  to schedule evaluation for a better portable system  Work on inhaler technique:  relax and gently blow all the way out then take a nice smooth deep breath back in, triggering the inhaler at same time you start breathing in.  Hold for up to 5 seconds if you can. Blow out thru nose. Rinse and gargle with water when done    Plan A = Automatic = symbicort/ tudorza plus Pantoprazole (protonix) 40 mg   Take  30-60 min before first meal of the day and Pepcid (famotidine)  20 mg one @  bedtime until return to office - this is the best way to tell whether stomach acid is contributing to your problem.    Plan B= Back up - Only use your albuterol (proair) as a rescue medication to be used if you can't catch your breath by resting or doing a relaxed purse lip breathing pattern.  - The less you use it, the better it will work when you need it. - Ok to use up to 2 puffs  every 4 hours if you must but call for immediate appointment if use goes up over your usual need - Don't leave home without it !!  (think of it like the spare tire for your car)   Plan C = crisis - only use the neb if you try the proair  First and it fails to work     Please remember to go to the  x-ray department downstairs for your tests - we will call you with the results when they are available.  See Tammy NP w/in 4 weeks with all your medications, even over the counter meds, separated in two separate bags, the ones you take no matter what vs the ones you stop once you feel better and take only as needed when you feel you need them.   Tammy  will generate for you a new user friendly medication calendar that will put us all on the same page re: your medication use.     Without this  process, it simply isn't possible to assure that we are providing  your outpatient care  with  the attention to detail we feel you deserve.   If we cannot assure that you're getting that kind of care,  then we cannot manage your problem effectively from this clinic.  Once you have seen Tammy and we are sure that we're all on the same page with your medication use she will arrange follow up with me.  Late add ? Needs ics/laba per neb ?

## 2015-03-16 NOTE — Telephone Encounter (Signed)
Attempted to call pt Line became disconnected Attempted to call again and line busy  Will try to call back later

## 2015-03-16 NOTE — Progress Notes (Signed)
Subjective:     Patient ID: Troy MeiersRobert R Bates, male   DOB: 1956/01/08    MRN: [id number]   Brief patient profile:  59 yowm obese smoker with new onset doe x 2014 placed on 02 after admit to [facility]:   Admit date: 05/18/2014 Discharge date: 05/20/2014  T  Recommendations for Outpatient Follow-up:  -Will be discharged home today. -Advised to follow up with PCP in 2 weeks.  Discharge Diagnoses:  Active Problems:  COPD exacerbation  Acute on chronic respiratory failure  Tobacco abuse   Discharge Condition: Stable and improved  Filed Weights   05/18/14 1430  Weight: 86.183 kg (190 lb)    History of present illness:  This is a 59 year old man who has a history of COPD and continues to smoke half a pack of cigarettes a day now presents with 3-4 day history of increasing shortness of breath associated with a cough productive of green/brown sputum. He is usually oxygen dependent and has 2 L/m oxygen at home. He denies any chest pain, palpitations, fever, nausea or vomiting. Treatment in the emergency room has failed to improve him to a level that he feels he can go home. He is now being admitted for treatment of his exacerbation of COPD.   Hospital Course:   Acute on Chronic Respiratory failure -2/2 COPD. -See below for details.  COPD with Acute Exacerbation -Not optimally medicated at home. -Add symbicort/spiriva and PRN albuterol MDI. -Continue steroid taper.      10/27/2014 1st [facility] office visit/ Julious Langlois  / 02 2lpm 24/7 but still smoking "chantix too expensive" Chief Complaint  Patient presents with  . Advice Only    COPD; RLL Lesion on CT Chest; pain in chest when cough or sneeze; SOB most of time; chest tightness; prod cough w/green mucus   at his best still wears 02 2lpm 24/7 and [facility] / uses [facility] / can't do [employer]  Worse than usual since 3-4 weeks prior to OV   With abrupt worsening cough > variably productive of  bloody mucus but usually green less so finishing up 9/10 days levaquin with ct suggestive of cavitary pna RLL 10/18/14  No pain on deep breathing  Already took am symb/spiriva but very confused with use of saba in various forms  rec Please remember to go to the  x-ray department downstairs for your tests - we will call you with the results when they are available. Plan A = automatic = symbicort /spiriva Plan B = backup Only use your albuterol as a rescue medication to be used if you can't catch your breath  Plan C = crisis  Only use albuterol  2.5 up to every 4 hours if you must  office visit in 4 weeks, sooner if needed with pfts and all medications   12/08/2014 f/u ov/Dajour Pierpoint re: GOLD III/IV min reversibility/ still smoking  Chief Complaint  Patient presents with  . Follow-up    PFT done today. Pt states that his breathing seems some better. He states he has been bloated and does get SOB when he eats.    Walking to [facility] on 02 2lpm and oob on return but doesn't need to stop slow pace  rec The key is to stop smoking completely before smoking completely stops you!  Try tudorza one twice daily instead of spiriva and if you like it fill it - late add needs f/u cxr/ placed in tickle    Admit date: 01/03/2015 Discharge  date: 01/06/2015  Recommendations for Outpatient Follow-up:  1. Follow up with your PCP in one week.   Discharge Diagnoses:  Principal Problem:  Acute on chronic respiratory failure Active Problems:  COPD exacerbation  Cigarette smoker  Obesity  Hypertension  Sleep apnea  Leukocytosis  Hyponatremia  Tobacco abuse   Discharge Condition: Improved.  Diet recommendation: Cardiac diet.  Filed Weights   01/04/15 1229 01/05/15 0430 01/06/15 0424  Weight: 98.431 kg (217 lb) 98.93 kg (218 lb 1.6 oz) 99.791 kg (220 lb)    History of present illness: Patient was admitted by dr Karilyn Cota for SOB on Sept 28, 2016. As per his H and P: " Troy Bates is a 59 y.o. male  This is a 59 year old man, long-standing smoker who apparently quit smoking couple of months ago, who presents with a two-week history of dyspnea associated with a cough productive of yellow sputum. He denies any fever. He denies any chest pain, palpitations or limb weakness. When he arrived in the emergency room, he appeared to be in significant respiratory distress and was put on BiPAP. However, after bronchodilating, he has improved and is now requiring nasal cannula oxygen. He is normally on 2 L oxygen per minute at home. He is not on oral steroids chronically. He is now being admitted for further management.    Hospital Course: Patient was admitted into the hospital for COPD exacerbation. He was given Nebs, along with IV Levaquin and IV steroids. He was slow to respond, but did improve. He was kept in the hospital as he was having wheezing. He has oxygen at home, and uses his CPAP hs. He was able to ambulate without having any desat. He is anxious to go home, and thinks that he is now at his baseline. He is stable for discharge, and will be discharged today. I will give him Prednisone  BID for one week to take with food. He will continue another 5 days of oral Levaquin, and he will continue with his home meds. He will follow up with his PCP in one week.        01/19/2015  f/u ov/Kaely Hollan re:  COPD III/IV quit smoking 12/07/14  Chief Complaint  Patient presents with  . Follow-up    Pt states had to be hospitalized with COPD flare from 01/03/15-01/06/15. His breathing has not improved much since then. He gets out of breath walking just from room to room at home. He is using albuterol inhaler 2 x per day and uses neb with albuterol 2 x per wk.     No longer has cpap/ worse HB x sev weeks  rec Please see patient coordinator before you leave today  to schedule split night sleep study  Add maxzide 25 one daily to control leg swelling Work on inhaler technique:    Plan A = Automatic = symbicort/ tudorza plus Pantoprazole (protonix) 40 mg   Take  30-60 min before first meal of the day and Pepcid (famotidine)  20 mg one @  bedtime until return to office - this is the best way to tell whether stomach acid is contributing to your problem.   Plan B= Back up - Only use your albuterol (proair) as a rescue medication  Plan C = crisis - only use the neb if you try the proair  First and it fails to work GERD  Diet  Please schedule a follow up office visit in 6 weeks, call sooner if needed with cxr on return and  prevnar   03/16/2015 ext post hosp f/u ov/Wei [place] re: copd /chronic resp failure 02 desp s/p admit to [facility] for "lung infection" Chief Complaint  Patient presents with  . Follow-up    Pt c/o increased SOB and cough- prod with green sputum. He has also had some swelling in his legs for the past 10 days. He uses proair 2-3 x per wk and neb 2 x per wk on average.      No obvious patterns in day to day or daytime variability or assoc cough or   cp or chest tightness, subjective wheeze or overt sinus   symptoms. No unusual exp hx or h/o childhood pna/ asthma or knowledge of premature birth.  Sleeping ok without nocturnal  or early am exacerbation  of respiratory  c/o's or need for noct saba. Also denies any obvious fluctuation of symptoms with weather or environmental changes or other aggravating or alleviating factors except as outlined above   Current Medications, Allergies, Complete Past Medical History, Past Surgical History, Family History, and Social History were reviewed in [employer] record.  ROS  The following are not active complaints unless bolded sore throat, dysphagia, dental problems, itching, sneezing,  nasal congestion or excess/ purulent secretions, ear ache,   fever, chills, sweats, unintended wt loss, classically pleuritic or exertional cp, hemoptysis,  orthopnea pnd or leg swelling, presyncope, palpitations,  abdominal pain anorexia, nausea, vomiting, diarrhea  or change in bowel or bladder habits, change in stools or urine, dysuria,hematuria,  rash, arthralgias, visual complaints, headache, numbness, weakness or ataxia or problems with walking or coordination,  change in mood/affect or memory.           Objective:   Physical Exam    amb obese wm nad     12/08/2014           213  > 01/19/2015    225 >  03/16/2015   203     10/27/14 211 lb (95.709 kg)  05/28/14 190 lb (86.183 kg)  05/18/14 190 lb (86.183 kg)    Vital signs reviewed   HEENT: nl dentition, turbinates, and orophanx. Nl external ear canals without cough reflex   NECK :  without JVD/Nodes/TM/ nl carotid upstrokes bilaterally   LUNGS: no acc muscle use, distant bs bilaterally with end exp wheezes   CV:  RRR  no s3 or murmur or increase in P2,  1 + bilateral lower ext pitting edema   ABD:  Tensely obese  nontender with nl excursion in the supine position. No bruits or organomegaly, bowel sounds nl  MS:  warm without deformities, calf tenderness, cyanosis or clubbing  SKIN: warm and dry without lesions    NEURO:  alert, approp, no deficits     CXR PA and Lateral:   03/16/2015 :    I personally reviewed images and agree with radiology impression as follows:   Stable cardiomediastinal silhouette. No pneumothorax or pleural effusion is noted. Stable interstitial densities are noted throughout both lungs concerning for scarring. No acute pulmonary disease is noted. Mild multilevel degenerative disc disease is noted in the lower thoracic spine.     Assessment:

## 2015-03-18 ENCOUNTER — Encounter: Payer: Self-pay | Admitting: Internal Medicine

## 2015-03-18 NOTE — Assessment & Plan Note (Signed)
02 2lpm since [facility] admit 06/2014  - RA sat 12/08/2014  = 97% - 12/08/2014   Walked 2lpm x one lap @ 185 stopped due to  Sob/ slow pace, no desat   As of 03/16/2015 = 3lpm continuous 24/7

## 2015-03-18 NOTE — Assessment & Plan Note (Addendum)
-    PFT's  12/08/2014  FEV1 .99 (30 % ) ratio 50  p 23 % improvement from saba with DLCO  44 % corrects to 77 % for alv volume  Very severe with freq flares so needs laba/lama/ics  DDX of  difficult airways management all start with A and  include Adherence, Ace Inhibitors, Acid Reflux, Active Sinus Disease, Alpha 1 Antitripsin deficiency, Anxiety masquerading as Airways dz,  ABPA,  allergy(esp in young), Aspiration (esp in elderly), Adverse effects of meds,  Active smokers, A bunch of PE's (a small clot burden can't cause this syndrome unless there is already severe underlying pulm or vascular dz with poor reserve) plus two Bs  = Bronchiectasis and Beta blocker use..and one C= CHF  Adherence is always the initial "prime suspect" and is a multilayered concern that requires a "trust but verify" approach in every patient - starting with knowing how to use medications, especially inhalers, correctly, keeping up with refills and understanding the fundamental difference between maintenance and prns vs those medications only taken for a very short course and then stopped and not refilled.  - - The proper method of use, as well as anticipated side effects, of a metered-dose inhaler are discussed and demonstrated to the patient. Improved effectiveness after extensive coaching during this visit to a level of approximately 75 % from a baseline of 50 %   ? Acid (or non-acid) GERD > always difficult to exclude as up to 75% of pts in some series report no assoc GI/ Heartburn symptoms> rec continue max (24h)  acid suppression and diet restrictions/ reviewed     ? Active sinus infection > already on doxy > finish  ? Actively smoking > strongly suspect but he denies > re-enforced impt of maint off   ? Chf/ probably at least has component cor pulmonale > red add prn lasix  Desperately needs med reconiliation.  To keep things simple, I have asked the patient to first separate medicines that are perceived as  maintenance, that is to be taken daily "no matter what", from those medicines that are taken on only on an as-needed basis and I have given the patient examples of both, and then return to see our NP to generate a  detailed  medication calendar which should be followed until the next physician sees the patient and updates it.     I had an extended discussion with the patient reviewing all relevant studies completed to date and  lasting 15 to 20 minutes of a 25 minute visit    Each maintenance medication was reviewed in detail including most importantly the difference between maintenance and prns and under what circumstances the prns are to be triggered using an action plan format that is not reflected in the computer generated alphabetically organized AVS.    Please see instructions for details which were reviewed in writing and the patient given a copy highlighting the part that I personally wrote and discussed at today's ov.

## 2015-03-19 MED ORDER — FLUTTER DEVI: Status: AC

## 2015-03-19 NOTE — Telephone Encounter (Signed)
Spoke with pt and he states that at ov on 03/16/15 Dr Sherene SiresWert had mentioned him getting a flutter valve.  I reviewed note but didn't see this mentioned.  Dr Sherene SiresWert please advise if you want pt to have a flutter valve.

## 2015-03-19 NOTE — Telephone Encounter (Signed)
Yes needs the flutter

## 2015-03-19 NOTE — Telephone Encounter (Signed)
Spoke with pt and advised that flutter valve would be left at front desk for pick up and nurse will provide instructions on how to use when he comes for pick up.

## 2015-03-27 ENCOUNTER — Ambulatory Visit: Payer: Medicaid Other | Admitting: Internal Medicine

## 2015-04-17 ENCOUNTER — Encounter: Payer: Medicaid Other | Admitting: Adult Health

## 2015-04-19 ENCOUNTER — Telehealth: Payer: Self-pay | Admitting: Internal Medicine

## 2015-04-19 NOTE — Telephone Encounter (Signed)
MW has reviewed CPAP DL  Per MW- CPAP is working, but not using enough, needs to be more compliant or ins won't pay  Pt aware and states that he is not sleeping much due to stress from family issues  He will try to use machine more

## 2015-04-21 ENCOUNTER — Other Ambulatory Visit: Payer: Self-pay | Admitting: Internal Medicine

## 2015-05-01 ENCOUNTER — Encounter: Payer: Self-pay | Admitting: Internal Medicine

## 2015-06-06 ENCOUNTER — Institutional Professional Consult (permissible substitution): Payer: Medicaid Other | Admitting: Pulmonary Disease

## 2015-08-02 ENCOUNTER — Encounter (HOSPITAL_COMMUNITY): Payer: Self-pay

## 2015-08-02 ENCOUNTER — Emergency Department (HOSPITAL_COMMUNITY)
Admission: EM | Admit: 2015-08-02 | Discharge: 2015-08-02 | Disposition: A | Discharge status: Home / Self Care | Payer: Medicaid Other | Attending: Emergency Medicine | Admitting: Emergency Medicine

## 2015-08-02 DIAGNOSIS — J45909 Unspecified asthma, uncomplicated: Secondary | ICD-10-CM | POA: Diagnosis not present

## 2015-08-02 DIAGNOSIS — Z87891 Personal history of nicotine dependence: Secondary | ICD-10-CM | POA: Diagnosis not present

## 2015-08-02 DIAGNOSIS — I10 Essential (primary) hypertension: Secondary | ICD-10-CM | POA: Insufficient documentation

## 2015-08-02 DIAGNOSIS — J449 Chronic obstructive pulmonary disease, unspecified: Secondary | ICD-10-CM | POA: Insufficient documentation

## 2015-08-02 DIAGNOSIS — Z79899 Other long term (current) drug therapy: Secondary | ICD-10-CM | POA: Diagnosis not present

## 2015-08-02 DIAGNOSIS — R569 Unspecified convulsions: Secondary | ICD-10-CM | POA: Diagnosis present

## 2015-08-02 HISTORY — DX: Unspecified convulsions: R56.9

## 2015-08-02 LAB — COMPREHENSIVE METABOLIC PANEL
ALK PHOS: 60 U/L (ref 38–126)
ALT: 14 U/L — ABNORMAL LOW (ref 17–63)
ANION GAP: 7 (ref 5–15)
AST: 15 U/L (ref 15–41)
Albumin: 3.3 g/dL — ABNORMAL LOW (ref 3.5–5.0)
BILIRUBIN TOTAL: 0.3 mg/dL (ref 0.3–1.2)
BUN: 12 mg/dL (ref 6–20)
CALCIUM: 8.8 mg/dL — AB (ref 8.9–10.3)
CO2: 33 mmol/L — ABNORMAL HIGH (ref 22–32)
Chloride: 101 mmol/L (ref 101–111)
Creatinine, Ser: 1.25 mg/dL — ABNORMAL HIGH (ref 0.61–1.24)
GFR calc Af Amer: >60 mL/min (ref >60)
Glucose, Bld: 88 mg/dL (ref 65–99)
POTASSIUM: 3.9 mmol/L (ref 3.5–5.1)
Sodium: 141 mmol/L (ref 135–145)
Total Protein: 6.2 g/dL — ABNORMAL LOW (ref 6.5–8.1)

## 2015-08-02 LAB — CBC WITH DIFFERENTIAL/PLATELET
BASOS PCT: 1 %
Basophils Absolute: 0.2 10*3/uL — ABNORMAL HIGH (ref 0.0–0.1)
EOS PCT: 2 %
Eosinophils Absolute: 0.3 10*3/uL (ref 0.0–0.7)
HEMATOCRIT: 43.1 % (ref 39.0–52.0)
HEMOGLOBIN: 14.8 g/dL (ref 13.0–17.0)
LYMPHS ABS: 3.1 10*3/uL (ref 0.7–4.0)
Lymphocytes Relative: 18 %
MCH: 30.7 pg (ref 26.0–34.0)
MCHC: 34.3 g/dL (ref 30.0–36.0)
MCV: 89.4 fL (ref 78.0–100.0)
Monocytes Absolute: 1.5 10*3/uL — ABNORMAL HIGH (ref 0.1–1.0)
Monocytes Relative: 9 %
NEUTROS ABS: 12 10*3/uL — AB (ref 1.7–7.7)
Neutrophils Relative %: 70 %
Platelets: 296 10*3/uL (ref 150–400)
RBC: 4.82 MIL/uL (ref 4.22–5.81)
RDW: 14 % (ref 11.5–15.5)
WBC: 17.1 10*3/uL — ABNORMAL HIGH (ref 4.0–10.5)

## 2015-08-02 MED ORDER — LEVETIRACETAM IN NACL 1500 MG/100ML IV SOLN
1500.0000 mg | Freq: Once | INTRAVENOUS | Status: AC
  Administered 2015-08-02: 1500 mg via INTRAVENOUS
  Filled 2015-08-02 (×2): qty 100

## 2015-08-02 MED ORDER — LORAZEPAM 2 MG/ML IJ SOLN
1.0000 mg | INTRAMUSCULAR | Status: DC | PRN
  Administered 2015-08-02: 1 mg via INTRAVENOUS
  Filled 2015-08-02: qty 1

## 2015-08-02 MED ORDER — LEVETIRACETAM 500 MG PO TABS: 500.0000 mg | ORAL_TABLET | Freq: Two times a day (BID) | ORAL | Status: AC

## 2015-08-02 NOTE — ED Notes (Signed)
Pt given urinal to obtain sample

## 2015-08-02 NOTE — ED Notes (Signed)
Pt states he was diagnosed with seizures about six months ago.

## 2015-08-02 NOTE — ED Notes (Signed)
Per EMS, pt fell and then had a seizure. Pt has not taken his keppra in about a month because he can not afford it. Also, complain of left sided chest pain that comes and goes

## 2015-08-02 NOTE — Discharge Instructions (Signed)

## 2015-08-02 NOTE — ED Provider Notes (Signed)
CSN: [id number]     Arrival date & time 08/02/15  1541 History   First MD Initiated Contact with Patient 08/02/15 1546     Chief Complaint  Patient presents with  . Seizures     HPI  Patient presents for evaluation after a seizure.  Was diagnosed with a seizure disorder about 6 months ago at [facility].  Dates that he was on Keppra and seizure free.  He has been offered a month.  He has a follow-up appointment with his primary care physician for refill on his medications but has been off his medications for most of the month.  Present home today.  He had a friend that the house.  He went to the restroom.  A seizure while on the toilet.  No apparent injury.  He did not strike his head.  No lacerations.  No neck pain.  Transferred by paramedics postictal at the scene.  Awake alert and oriented on his arrival here.  Denies alcohol use.  Past Medical History  Diagnosis Date  . COPD (chronic obstructive pulmonary disease) (HCC)   . Asthma   . Hypertension   . Sleep apnea   . Tobacco abuse     2016  . Morbid obesity (HCC)   . Bilateral leg edema   . Seizures [facility])    Past Surgical History  Procedure Laterality Date  . Back surgery    . Hernia repair    . Foot surgery     Family History  Problem Relation Age of Onset  . Emphysema Paternal Aunt    Social History  Substance Use Topics  . Smoking status: Former Smoker -- 1.00 packs/day for 40 years    Types: Cigarettes    Quit date: 12/07/2014  . Smokeless tobacco: Never Used  . Alcohol Use: 0.0 oz/week    0 Standard drinks or equivalent per week    Review of Systems  Constitutional: Negative for fever, chills, diaphoresis, appetite change and fatigue.  HENT: Negative for mouth sores, sore throat and trouble swallowing.   Eyes: Negative for visual disturbance.  Respiratory: Negative for cough, chest tightness, shortness of breath and wheezing.   Cardiovascular: Negative for chest pain.  Gastrointestinal: Negative  for nausea, vomiting, abdominal pain, diarrhea and abdominal distention.  Endocrine: Negative for polydipsia, polyphagia and polyuria.  Genitourinary: Negative for dysuria, frequency and hematuria.  Musculoskeletal: Negative for gait problem.  Skin: Negative for color change, pallor and rash.  Neurological: Positive for seizures. Negative for dizziness, syncope, light-headedness and headaches.  Hematological: Does not bruise/bleed easily.  Psychiatric/Behavioral: Negative for behavioral problems and confusion.      Allergies  Sudafed  Home Medications   Prior to Admission medications   Medication Sig Start Date End Date Taking? Authorizing Provider  Aclidinium Bromide (TUDORZA PRESSAIR) 400 MCG/ACT AEPB Inhale 1 puff into the lungs 2 (two) times daily. One twice daily 01/01/15   Nyoka Cowden, MD  albuterol (PROVENTIL HFA;VENTOLIN HFA) 108 (90 BASE) MCG/ACT inhaler Inhale 2 puffs into the lungs every 6 (six) hours as needed for wheezing or shortness of breath.    Historical Provider, MD  albuterol (PROVENTIL) (2.5 MG/3ML) 0.083% nebulizer solution Take 3 mLs (2.5 mg total) by nebulization every 2 (two) hours as needed for wheezing. 01/02/14   Nita Sells Mikhail, DO  budesonide-formoterol (SYMBICORT) 160-4.5 MCG/ACT inhaler Inhale 2 puffs into the lungs 2 (two) times daily. 05/20/14   Henderson Cloud, MD  doxycycline (VIBRAMYCIN) 100 MG capsule Take 100 mg  by mouth 2 (two) times daily.    Historical Provider, MD  famotidine (PEPCID) 20 MG tablet One at bedtime 01/19/15   Nyoka CowdenMichael B Wert, MD  furosemide (LASIX) 40 MG tablet One daily as needed for swelling 03/16/15   Nyoka CowdenMichael B Wert, MD  HYDROcodone-acetaminophen (NORCO/VICODIN) 5-325 MG tablet Take 1 tablet by mouth every 4 (four) hours as needed for moderate pain. Patient not taking: Reported on 03/16/2015 01/06/15   Houston SirenPeter Le, MD  levETIRAcetam (KEPPRA) 500 MG tablet Take 1 tablet (500 mg total) by mouth 2 (two) times daily. 08/02/15    Rolland PorterMark Olawale Marney, MD  LORazepam (ATIVAN) 0.5 MG tablet Take 1 tablet (0.5 mg total) by mouth every 6 (six) hours as needed for anxiety or sleep. Patient not taking: Reported on 03/16/2015 01/06/15   Houston SirenPeter Le, MD  OXYGEN Inhale 3 L into the lungs. 24/7    Historical Provider, MD  pantoprazole (PROTONIX) 40 MG tablet Take 1 tablet (40 mg total) by mouth daily. Take 30-60 min before first meal of the day 01/19/15   Nyoka CowdenMichael B Wert, MD  Respiratory Therapy Supplies (FLUTTER) DEVI Use as directed 03/19/15   Nyoka CowdenMichael B Wert, MD  triamterene-hydrochlorothiazide (MAXZIDE-25) 37.5-25 MG tablet Take 1 tablet by mouth daily. 01/19/15   Nyoka CowdenMichael B Wert, MD   BP 112/73 mmHg  Pulse 98  Temp(Src) 97.9 F (36.6 C) (Oral)  Resp 23  Ht 5\' 8"  (1.727 m)  Wt 180 lb (81.647 kg)  BMI 27.38 kg/m2  SpO2 98% Physical Exam  Constitutional: He is oriented to person, place, and time. He appears well-developed and well-nourished. No distress.  HENT:  Head: Normocephalic.  No sun injury.  No tender spots.  Lacerations.  No motor TMs, muscles, from his nose or mouth.  Symmetric reactive pupils.  Eyes: Conjunctivae are normal. Pupils are equal, round, and reactive to light. No scleral icterus.  Neck: Normal range of motion. Neck supple. No thyromegaly present.  Signs of trauma to the head.  He is nontender in midline spine.  He is oriented lucid and clinically sober.  No distracting injuries.  Neck cleared by Nexus.  Collar removed.  Cardiovascular: Normal rate and regular rhythm.  Exam reveals no gallop and no friction rub.   No murmur heard. Pulmonary/Chest: Effort normal and breath sounds normal. No respiratory distress. He has no wheezes. He has no rales.  Abdominal: Soft. Bowel sounds are normal. He exhibits no distension. There is no tenderness. There is no rebound.  Musculoskeletal: Normal range of motion.  Neurological: He is alert and oriented to person, place, and time.  Moving all extremities without difficulty.   Skin: Skin is warm and dry. No rash noted.  Psychiatric: He has a normal mood and affect. His behavior is normal.    ED Course  Procedures (including critical care time) Labs Review Labs Reviewed  CBC WITH DIFFERENTIAL/PLATELET - Abnormal; Notable for the following:    WBC 17.1 (*)    Neutro Abs 12.0 (*)    Monocytes Absolute 1.5 (*)    Basophils Absolute 0.2 (*)    All other components within normal limits  COMPREHENSIVE METABOLIC PANEL - Abnormal; Notable for the following:    CO2 33 (*)    Creatinine, Ser 1.25 (*)    Calcium 8.8 (*)    Total Protein 6.2 (*)    Albumin 3.3 (*)    ALT 14 (*)    All other components within normal limits  URINE RAPID DRUG SCREEN, HOSP PERFORMED  Imaging Review No results found. I have personally reviewed and evaluated these images and lab results as part of my medical decision-making.   EKG Interpretation None      MDM   Final diagnoses:  Seizure (HCC)     Pt remained asymptomatic in the emergency room.  Given neurology locally for follow-up.  Refill on his Keppra.  Discharged in care of his daughter.    Rolland Porter, MD 08/02/15 1919

## 2015-09-27 ENCOUNTER — Encounter: Payer: Self-pay | Admitting: Pulmonary Disease

## 2016-09-26 NOTE — Patient Instructions (Signed)
Your procedure is scheduled on: 10/03/2016  Report to [facility] at  645  AM.  Call this number if you have problems the morning of surgery: [phone]   Do not eat food or drink liquids :After Midnight.      Take these medicines the morning of surgery with A SIP OF WATER: pepcid, keppra, protonix, maxzide.   Do not wear jewelry, make-up or nail polish.  Do not wear lotions, powders, or perfumes. You may wear deodorant.  Do not shave 48 hours prior to surgery.  Do not bring valuables to the hospital.  Contacts, dentures or bridgework may not be worn into surgery.  Leave suitcase in the car. After surgery it may be brought to your room.  For patients admitted to the hospital, checkout time is 11:00 AM the day of discharge.   Patients discharged the day of surgery will not be allowed to drive home.  :     Please read over the following fact sheets that you were given: Coughing and Deep Breathing, Surgical Site Infection Prevention, Anesthesia Post-op Instructions and Care and Recovery After Surgery    Cataract A cataract is a clouding of the lens of the eye. When a lens becomes cloudy, vision is reduced based on the degree and nature of the clouding. Many cataracts reduce vision to some degree. Some cataracts make people more near-sighted as they develop. Other cataracts increase glare. Cataracts that are ignored and become worse can sometimes look white. The white color can be seen through the pupil. CAUSES   Aging. However, cataracts may occur at any age, even in newborns.   Certain drugs.   Trauma to the eye.   Certain diseases such as diabetes.   Specific eye diseases such as chronic inflammation inside the eye or a sudden attack of a rare form of glaucoma.   Inherited or acquired medical problems.  SYMPTOMS   Gradual, progressive drop in vision in the affected eye.   Severe, rapid visual loss. This most often happens when trauma is the cause.  DIAGNOSIS  To detect a  cataract, an eye doctor examines the lens. Cataracts are best diagnosed with an exam of the eyes with the pupils enlarged (dilated) by drops.  TREATMENT  For an early cataract, vision may improve by using different eyeglasses or stronger lighting. If that does not help your vision, surgery is the only effective treatment. A cataract needs to be surgically removed when vision loss interferes with your everyday activities, such as driving, reading, or watching TV. A cataract may also have to be removed if it prevents examination or treatment of another eye problem. Surgery removes the cloudy lens and usually replaces it with a substitute lens (intraocular lens, IOL).  At a time when both you and your doctor agree, the cataract will be surgically removed. If you have cataracts in both eyes, only one is usually removed at a time. This allows the operated eye to heal and be out of danger from any possible problems after surgery (such as infection or poor wound healing). In rare cases, a cataract may be doing damage to your eye. In these cases, your caregiver may advise surgical removal right away. The vast majority of people who have cataract surgery have better vision afterward. HOME CARE INSTRUCTIONS  If you are not planning surgery, you may be asked to do the following:  Use different eyeglasses.   Use stronger or brighter lighting.   Ask your eye doctor about reducing your  medicine dose or changing medicines if it is thought that a medicine caused your cataract. Changing medicines does not make the cataract go away on its own.   Become familiar with your surroundings. Poor vision can lead to injury. Avoid bumping into things on the affected side. You are at a higher risk for tripping or falling.   Exercise extreme care when driving or operating machinery.   Wear sunglasses if you are sensitive to bright light or experiencing problems with glare.  SEEK IMMEDIATE MEDICAL CARE IF:   You have a  worsening or sudden vision loss.   You notice redness, swelling, or increasing pain in the eye.   You have a fever.  Document Released: 03/24/2005 Document Revised: 03/13/2011 Document Reviewed: 11/15/2010 [facility] Patient Information 2012 [place].PATIENT INSTRUCTIONS POST-ANESTHESIA  IMMEDIATELY FOLLOWING SURGERY:  Do not drive or operate machinery for the first twenty four hours after surgery.  Do not make any important decisions for twenty four hours after surgery or while taking narcotic pain medications or sedatives.  If you develop intractable nausea and vomiting or a severe headache please notify your doctor immediately.  FOLLOW-UP:  Please make an appointment with your surgeon as instructed. You do not need to follow up with anesthesia unless specifically instructed to do so.  WOUND CARE INSTRUCTIONS (if applicable):  Keep a dry clean dressing on the anesthesia/puncture wound site if there is drainage.  Once the wound has quit draining you may leave it open to air.  Generally you should leave the bandage intact for twenty four hours unless there is drainage.  If the epidural site drains for more than 36-48 hours please call the anesthesia department.  QUESTIONS?:  Please feel free to call your physician or the hospital operator if you have any questions, and they will be happy to assist you.

## 2016-09-30 ENCOUNTER — Encounter (HOSPITAL_COMMUNITY): Payer: Self-pay

## 2016-09-30 ENCOUNTER — Encounter (HOSPITAL_COMMUNITY)
Admission: RE | Admit: 2016-09-30 | Discharge: 2016-09-30 | Disposition: A | Discharge status: Home / Self Care | Payer: Medicare Other | Source: Ambulatory Visit | Attending: Ophthalmology | Admitting: Ophthalmology

## 2016-09-30 DIAGNOSIS — J45909 Unspecified asthma, uncomplicated: Secondary | ICD-10-CM | POA: Diagnosis not present

## 2016-09-30 DIAGNOSIS — Z01810 Encounter for preprocedural cardiovascular examination: Secondary | ICD-10-CM

## 2016-09-30 DIAGNOSIS — K219 Gastro-esophageal reflux disease without esophagitis: Secondary | ICD-10-CM | POA: Diagnosis not present

## 2016-09-30 DIAGNOSIS — G4730 Sleep apnea, unspecified: Secondary | ICD-10-CM | POA: Diagnosis not present

## 2016-09-30 DIAGNOSIS — H269 Unspecified cataract: Secondary | ICD-10-CM | POA: Diagnosis present

## 2016-09-30 DIAGNOSIS — Z79899 Other long term (current) drug therapy: Secondary | ICD-10-CM | POA: Diagnosis not present

## 2016-09-30 DIAGNOSIS — Z01812 Encounter for preprocedural laboratory examination: Secondary | ICD-10-CM | POA: Insufficient documentation

## 2016-09-30 DIAGNOSIS — F17200 Nicotine dependence, unspecified, uncomplicated: Secondary | ICD-10-CM | POA: Diagnosis not present

## 2016-09-30 DIAGNOSIS — H25811 Combined forms of age-related cataract, right eye: Secondary | ICD-10-CM | POA: Diagnosis not present

## 2016-09-30 DIAGNOSIS — I10 Essential (primary) hypertension: Secondary | ICD-10-CM | POA: Diagnosis not present

## 2016-09-30 HISTORY — DX: Gastro-esophageal reflux disease without esophagitis: K21.9

## 2016-09-30 HISTORY — DX: Dyspnea, unspecified: R06.00

## 2016-09-30 LAB — CBC WITH DIFFERENTIAL/PLATELET
BASOS ABS: 0.1 10*3/uL (ref 0.0–0.1)
BASOS PCT: 1 %
EOS ABS: 0.6 10*3/uL (ref 0.0–0.7)
Eosinophils Relative: 5 %
HEMATOCRIT: 49.9 % (ref 39.0–52.0)
Hemoglobin: 17.1 g/dL — ABNORMAL HIGH (ref 13.0–17.0)
Lymphocytes Relative: 18 %
Lymphs Abs: 2.4 10*3/uL (ref 0.7–4.0)
MCH: 30.3 pg (ref 26.0–34.0)
MCHC: 34.3 g/dL (ref 30.0–36.0)
MCV: 88.5 fL (ref 78.0–100.0)
Monocytes Absolute: 1.2 10*3/uL — ABNORMAL HIGH (ref 0.1–1.0)
Monocytes Relative: 9 %
NEUTROS ABS: 9.2 10*3/uL — AB (ref 1.7–7.7)
Neutrophils Relative %: 67 %
Platelets: 283 10*3/uL (ref 150–400)
RBC: 5.64 MIL/uL (ref 4.22–5.81)
RDW: 13.5 % (ref 11.5–15.5)
WBC: 13.4 10*3/uL — AB (ref 4.0–10.5)

## 2016-09-30 LAB — BASIC METABOLIC PANEL
ANION GAP: 8 (ref 5–15)
BUN: 12 mg/dL (ref 6–20)
CHLORIDE: 95 mmol/L — AB (ref 101–111)
CO2: 34 mmol/L — AB (ref 22–32)
CREATININE: 1.76 mg/dL — AB (ref 0.61–1.24)
Calcium: 9 mg/dL (ref 8.9–10.3)
GFR calc non Af Amer: 40 mL/min — ABNORMAL LOW (ref >60)
GFR, EST AFRICAN AMERICAN: 46 mL/min — AB (ref >60)
Glucose, Bld: 97 mg/dL (ref 65–99)
POTASSIUM: 4.2 mmol/L (ref 3.5–5.1)
SODIUM: 137 mmol/L (ref 135–145)

## 2016-10-03 ENCOUNTER — Ambulatory Visit (HOSPITAL_COMMUNITY): Payer: Medicare Other | Admitting: Anesthesiology

## 2016-10-03 ENCOUNTER — Ambulatory Visit (HOSPITAL_COMMUNITY)
Admission: RE | Admit: 2016-10-03 | Discharge: 2016-10-03 | Disposition: A | Discharge status: Home / Self Care | Payer: Medicare Other | Source: Ambulatory Visit | Attending: Ophthalmology | Admitting: Ophthalmology

## 2016-10-03 ENCOUNTER — Encounter (HOSPITAL_COMMUNITY)
Admission: RE | Disposition: A | Discharge status: Home / Self Care | Payer: Self-pay | Source: Ambulatory Visit | Attending: Ophthalmology

## 2016-10-03 ENCOUNTER — Encounter (HOSPITAL_COMMUNITY): Payer: Self-pay | Admitting: *Deleted

## 2016-10-03 DIAGNOSIS — Z79899 Other long term (current) drug therapy: Secondary | ICD-10-CM | POA: Insufficient documentation

## 2016-10-03 DIAGNOSIS — J45909 Unspecified asthma, uncomplicated: Secondary | ICD-10-CM | POA: Insufficient documentation

## 2016-10-03 DIAGNOSIS — G4730 Sleep apnea, unspecified: Secondary | ICD-10-CM | POA: Insufficient documentation

## 2016-10-03 DIAGNOSIS — I10 Essential (primary) hypertension: Secondary | ICD-10-CM | POA: Insufficient documentation

## 2016-10-03 DIAGNOSIS — H25811 Combined forms of age-related cataract, right eye: Secondary | ICD-10-CM | POA: Insufficient documentation

## 2016-10-03 DIAGNOSIS — K219 Gastro-esophageal reflux disease without esophagitis: Secondary | ICD-10-CM | POA: Diagnosis not present

## 2016-10-03 DIAGNOSIS — F17200 Nicotine dependence, unspecified, uncomplicated: Secondary | ICD-10-CM | POA: Insufficient documentation

## 2016-10-03 HISTORY — PX: CATARACT EXTRACTION W/PHACO: SHX586

## 2016-10-03 SURGERY — PHACOEMULSIFICATION, CATARACT, WITH IOL INSERTION
Anesthesia: Monitor Anesthesia Care | Site: Eye | Laterality: Right

## 2016-10-03 MED ORDER — ALBUTEROL SULFATE HFA 108 (90 BASE) MCG/ACT IN AERS
2.0000 | INHALATION_SPRAY | Freq: Four times a day (QID) | RESPIRATORY_TRACT | Status: DC | PRN
  Filled 2016-10-03: qty 6.7

## 2016-10-03 MED ORDER — PROVISC 10 MG/ML IO SOLN
INTRAOCULAR | Status: DC | PRN
  Administered 2016-10-03: 0.85 mL via INTRAOCULAR

## 2016-10-03 MED ORDER — MIDAZOLAM HCL 2 MG/2ML IJ SOLN
INTRAMUSCULAR | Status: AC
  Filled 2016-10-03: qty 2

## 2016-10-03 MED ORDER — POVIDONE-IODINE 5 % OP SOLN
OPHTHALMIC | Status: DC | PRN
  Administered 2016-10-03: 1 via OPHTHALMIC

## 2016-10-03 MED ORDER — ALBUTEROL SULFATE (2.5 MG/3ML) 0.083% IN NEBU
3.0000 mL | INHALATION_SOLUTION | RESPIRATORY_TRACT | Status: AC
  Administered 2016-10-03: 3 mL via RESPIRATORY_TRACT
  Filled 2016-10-03: qty 3

## 2016-10-03 MED ORDER — LIDOCAINE HCL 3.5 % OP GEL
1.0000 "application " | Freq: Once | OPHTHALMIC | Status: AC
  Administered 2016-10-03: 1 via OPHTHALMIC

## 2016-10-03 MED ORDER — LIDOCAINE HCL (PF) 1 % IJ SOLN
INTRAMUSCULAR | Status: DC | PRN
  Administered 2016-10-03: .7 mL

## 2016-10-03 MED ORDER — BSS IO SOLN
INTRAOCULAR | Status: DC | PRN
  Administered 2016-10-03: 15 mL via INTRAOCULAR

## 2016-10-03 MED ORDER — PHENYLEPHRINE HCL 2.5 % OP SOLN
1.0000 [drp] | OPHTHALMIC | Status: AC
  Administered 2016-10-03 (×3): 1 [drp] via OPHTHALMIC

## 2016-10-03 MED ORDER — CYCLOPENTOLATE-PHENYLEPHRINE 0.2-1 % OP SOLN
1.0000 [drp] | OPHTHALMIC | Status: AC
  Administered 2016-10-03 (×3): 1 [drp] via OPHTHALMIC

## 2016-10-03 MED ORDER — ALBUTEROL SULFATE (2.5 MG/3ML) 0.083% IN NEBU
2.5000 mg | INHALATION_SOLUTION | Freq: Once | RESPIRATORY_TRACT | Status: AC
  Administered 2016-10-03: 2.5 mg via RESPIRATORY_TRACT
  Filled 2016-10-03: qty 3

## 2016-10-03 MED ORDER — MIDAZOLAM HCL 2 MG/2ML IJ SOLN
1.0000 mg | Freq: Once | INTRAMUSCULAR | Status: AC | PRN
  Administered 2016-10-03: 1 mg via INTRAVENOUS
  Filled 2016-10-03: qty 2

## 2016-10-03 MED ORDER — EPINEPHRINE PF 1 MG/ML IJ SOLN
INTRAMUSCULAR | Status: AC
  Filled 2016-10-03: qty 1

## 2016-10-03 MED ORDER — LACTATED RINGERS IV SOLN
INTRAVENOUS | Status: DC
  Administered 2016-10-03: 08:00:00 via INTRAVENOUS

## 2016-10-03 MED ORDER — NEOMYCIN-POLYMYXIN-DEXAMETH 3.5-10000-0.1 OP SUSP
OPHTHALMIC | Status: DC | PRN
  Administered 2016-10-03: 2 [drp] via OPHTHALMIC

## 2016-10-03 MED ORDER — ALBUTEROL SULFATE (2.5 MG/3ML) 0.083% IN NEBU
INHALATION_SOLUTION | RESPIRATORY_TRACT | Status: AC
  Filled 2016-10-03: qty 3

## 2016-10-03 MED ORDER — TETRACAINE HCL 0.5 % OP SOLN
1.0000 [drp] | OPHTHALMIC | Status: AC
  Administered 2016-10-03 (×3): 1 [drp] via OPHTHALMIC

## 2016-10-03 MED ORDER — EPINEPHRINE PF 1 MG/ML IJ SOLN
INTRAMUSCULAR | Status: DC | PRN
  Administered 2016-10-03: 500 mL

## 2016-10-03 SURGICAL SUPPLY — 13 items
CLOTH BEACON ORANGE TIMEOUT ST (SAFETY) ×3 IMPLANT
EYE SHIELD UNIVERSAL CLEAR (GAUZE/BANDAGES/DRESSINGS) ×3 IMPLANT
GLOVE BIOGEL PI IND STRL 6.5 (GLOVE) ×1 IMPLANT
GLOVE BIOGEL PI IND STRL 7.0 (GLOVE) ×1 IMPLANT
GLOVE BIOGEL PI INDICATOR 6.5 (GLOVE) ×2
GLOVE BIOGEL PI INDICATOR 7.0 (GLOVE) ×2
PAD ARMBOARD 7.5X6 YLW CONV (MISCELLANEOUS) ×3 IMPLANT
SIGHTPATH CAT PROC W REG LENS (Ophthalmic Related) ×3 IMPLANT
SYRINGE LUER LOK 1CC (MISCELLANEOUS) ×3 IMPLANT
TAPE SURG TRANSPORE 1 IN (GAUZE/BANDAGES/DRESSINGS) ×1 IMPLANT
TAPE SURGICAL TRANSPORE 1 IN (GAUZE/BANDAGES/DRESSINGS) ×2
TAPE TRANSPARENT 1/2IN (GAUZE/BANDAGES/DRESSINGS) ×3 IMPLANT
WATER STERILE IRR 250ML POUR (IV SOLUTION) ×3 IMPLANT

## 2016-10-03 NOTE — Op Note (Signed)
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  Assistants: None  Anesthesia: Topical with MAC  Indications: Painless, progressive loss of vision with compromise of daily activities.  Surgery: Cataract Extraction with Intraocular lens Implant Right Eye  Discription: The patient had dilating drops and viscous lidocaine placed into the Right eye in the pre-op holding area. After transfer to the operating room, a time out was performed. The patient was then prepped and draped. Beginning with a 7m blade a paracentesis port was made at the surgeon's 2 o'clock position. The anterior chamber was then filled with 1% non-preserved lidocaine. This was followed by filling the anterior chamber with Provisc.  A 2.420mkeratome blade was used to make a clear corneal incision at the temporal limbus.  A bent cystatome needle was used to create a continuous tear capsulotomy. Hydrodissection was performed with balanced salt solution on a Fine canula. The lens nucleus was then removed using the phacoemulsification handpiece. Residual cortex was removed with the I&A handpiece. The anterior chamber and capsular bag were refilled with Provisc. A posterior chamber intraocular lens was placed into the capsular bag with it's injector. The implant was positioned with the Kuglan hook. The Provisc was then removed from the anterior chamber and capsular bag with the I&A handpiece. Stromal hydration of the main incision and paracentesis port was performed with BSS on a Fine canula. The wounds were tested for leak which was negative. The patient tolerated the procedure well. There were no operative complications. The patient was then transferred to the recovery room in stable condition.  Complications: None  Specimen: None  EBL: None  Prosthetic device: Abbott Technis, PCB00, power  23.0, SN [id number]

## 2016-10-03 NOTE — H&P (Signed)
I have reviewed the H&P, the patient was re-examined, and I have identified no interval changes in medical condition and plan of care since the history and physical of record  

## 2016-10-03 NOTE — Anesthesia Postprocedure Evaluation (Signed)
Anesthesia Post Note  Patient: Troy Bates  Procedure(s) Performed: Procedure(s) (LRB): CATARACT EXTRACTION PHACO AND INTRAOCULAR LENS PLACEMENT RIGHT EYE (Right)  Patient location during evaluation: Short Stay Anesthesia Type: MAC Level of consciousness: awake and alert and oriented Pain management: pain level controlled Vital Signs Assessment: post-procedure vital signs reviewed and stable Respiratory status: spontaneous breathing Cardiovascular status: stable Postop Assessment: no signs of nausea or vomiting Anesthetic complications: no     Last Vitals:  Vitals:   10/03/16 0815 10/03/16 0820  BP: 104/75 126/81  Pulse:    Resp: (!) 25 (!) 0  Temp:      Last Pain: There were no vitals filed for this visit.               Betsabe Iglesia

## 2016-10-03 NOTE — Anesthesia Preprocedure Evaluation (Signed)
Anesthesia Evaluation  Patient identified by MRN, date of birth, ID band  Airway Mallampati: I  TM Distance: >3 FB Neck ROM: Full    Dental  (+) Edentulous Upper, Edentulous Lower   Pulmonary shortness of breath and with exertion, asthma , sleep apnea (Level "5"), Continuous Positive Airway Pressure Ventilation and Oxygen sleep apnea , COPD,  COPD inhaler, Current Smoker,     + decreased breath sounds      Cardiovascular hypertension, Pt. on medications  Rhythm:Regular Rate:Normal     Neuro/Psych    GI/Hepatic GERD  Medicated and Controlled,  Endo/Other    Renal/GU      Musculoskeletal   Abdominal Normal abdominal exam  (+)   Peds  Hematology   Anesthesia Other Findings   Reproductive/Obstetrics                             Anesthesia Physical Anesthesia Plan  ASA: IV  Anesthesia Plan: MAC   Post-op Pain Management:    Induction: Intravenous  PONV Risk Score and Plan:   Airway Management Planned: Nasal Cannula  Additional Equipment:   Intra-op Plan:   Post-operative Plan:   Informed Consent:   Plan Discussed with: CRNA  Anesthesia Plan Comments:         Anesthesia Quick Evaluation

## 2016-10-03 NOTE — Discharge Instructions (Signed)
Moderate Conscious Sedation, Adult, Care After  These instructions provide you with information about caring for yourself after your procedure. Your health care provider may also give you more specific instructions. Your treatment has been planned according to current medical practices, but problems sometimes occur. Call your health care provider if you have any problems or questions after your procedure.  What can I expect after the procedure?  After your procedure, it is common:   To feel sleepy for several hours.   To feel clumsy and have poor balance for several hours.   To have poor judgment for several hours.   To vomit if you eat too soon.    Follow these instructions at home:  For at least 24 hours after the procedure:     Do not:  ? Participate in activities where you could fall or become injured.  ? Drive.  ? Use heavy machinery.  ? Drink alcohol.  ? Take sleeping pills or medicines that cause drowsiness.  ? Make important decisions or sign legal documents.  ? Take care of children on your own.   Rest.  Eating and drinking   Follow the diet recommended by your health care provider.   If you vomit:  ? Drink water, juice, or soup when you can drink without vomiting.  ? Make sure you have little or no nausea before eating solid foods.  General instructions   Have a responsible adult stay with you until you are awake and alert.   Take over-the-counter and prescription medicines only as told by your health care provider.   If you smoke, do not smoke without supervision.   Keep all follow-up visits as told by your health care provider. This is important.  Contact a health care provider if:   You keep feeling nauseous or you keep vomiting.   You feel light-headed.   You develop a rash.   You have a fever.  Get help right away if:   You have trouble breathing.  This information is not intended to replace advice given to you by your health care provider. Make sure you discuss any questions you have  with your health care provider.  Document Released: 01/12/2013 Document Revised: 08/27/2015 Document Reviewed: 07/14/2015  Elsevier Interactive Patient Education  2018 [employer]

## 2016-10-03 NOTE — Transfer of Care (Signed)
Immediate Anesthesia Transfer of Care Note  Patient: Troy Bates  Procedure(s) Performed: Procedure(s) with comments: CATARACT EXTRACTION PHACO AND INTRAOCULAR LENS PLACEMENT RIGHT EYE (Right) - CDE: 18.07  Patient Location: Short Stay  Anesthesia Type:MAC  Level of Consciousness: awake  Airway & Oxygen Therapy: Patient Spontanous Breathing  Post-op Assessment: Report given to RN  Post vital signs: Reviewed  Last Vitals:  Vitals:   10/03/16 0815 10/03/16 0820  BP: 104/75 126/81  Pulse:    Resp: (!) 25 (!) 0  Temp:      Last Pain: There were no vitals filed for this visit.       Complications: No apparent anesthesia complications

## 2016-10-06 ENCOUNTER — Encounter (HOSPITAL_COMMUNITY): Payer: Self-pay | Admitting: Ophthalmology

## 2016-10-22 ENCOUNTER — Encounter (HOSPITAL_COMMUNITY): Payer: Self-pay

## 2016-10-22 ENCOUNTER — Encounter (HOSPITAL_COMMUNITY)
Admission: RE | Admit: 2016-10-22 | Discharge: 2016-10-22 | Disposition: A | Discharge status: Home / Self Care | Payer: Medicare Other | Source: Ambulatory Visit | Attending: Ophthalmology | Admitting: Ophthalmology

## 2016-10-27 ENCOUNTER — Ambulatory Visit (HOSPITAL_COMMUNITY)
Admission: RE | Admit: 2016-10-27 | Discharge: 2016-10-27 | Disposition: A | Discharge status: Home / Self Care | Payer: Medicare Other | Source: Ambulatory Visit | Attending: Ophthalmology | Admitting: Ophthalmology

## 2016-10-27 ENCOUNTER — Ambulatory Visit (HOSPITAL_COMMUNITY): Payer: Medicare Other | Admitting: Anesthesiology

## 2016-10-27 ENCOUNTER — Encounter (HOSPITAL_COMMUNITY)
Admission: RE | Disposition: A | Discharge status: Home / Self Care | Payer: Self-pay | Source: Ambulatory Visit | Attending: Ophthalmology

## 2016-10-27 ENCOUNTER — Encounter (HOSPITAL_COMMUNITY): Payer: Self-pay | Admitting: Ophthalmology

## 2016-10-27 DIAGNOSIS — I10 Essential (primary) hypertension: Secondary | ICD-10-CM | POA: Insufficient documentation

## 2016-10-27 DIAGNOSIS — E7800 Pure hypercholesterolemia, unspecified: Secondary | ICD-10-CM | POA: Diagnosis not present

## 2016-10-27 DIAGNOSIS — Z9989 Dependence on other enabling machines and devices: Secondary | ICD-10-CM | POA: Diagnosis not present

## 2016-10-27 DIAGNOSIS — Z888 Allergy status to other drugs, medicaments and biological substances status: Secondary | ICD-10-CM | POA: Diagnosis not present

## 2016-10-27 DIAGNOSIS — K219 Gastro-esophageal reflux disease without esophagitis: Secondary | ICD-10-CM | POA: Insufficient documentation

## 2016-10-27 DIAGNOSIS — G40909 Epilepsy, unspecified, not intractable, without status epilepticus: Secondary | ICD-10-CM | POA: Diagnosis not present

## 2016-10-27 DIAGNOSIS — J449 Chronic obstructive pulmonary disease, unspecified: Secondary | ICD-10-CM | POA: Insufficient documentation

## 2016-10-27 DIAGNOSIS — F17200 Nicotine dependence, unspecified, uncomplicated: Secondary | ICD-10-CM | POA: Insufficient documentation

## 2016-10-27 DIAGNOSIS — G4730 Sleep apnea, unspecified: Secondary | ICD-10-CM | POA: Insufficient documentation

## 2016-10-27 DIAGNOSIS — H2512 Age-related nuclear cataract, left eye: Secondary | ICD-10-CM | POA: Insufficient documentation

## 2016-10-27 HISTORY — PX: CATARACT EXTRACTION W/PHACO: SHX586

## 2016-10-27 SURGERY — PHACOEMULSIFICATION, CATARACT, WITH IOL INSERTION
Anesthesia: Monitor Anesthesia Care | Site: Eye | Laterality: Left

## 2016-10-27 MED ORDER — LACTATED RINGERS IV SOLN
INTRAVENOUS | Status: DC
  Administered 2016-10-27: 08:00:00 via INTRAVENOUS

## 2016-10-27 MED ORDER — LIDOCAINE HCL 3.5 % OP GEL: 1.0000 "application " | Freq: Once | OPHTHALMIC | Status: DC

## 2016-10-27 MED ORDER — LIDOCAINE HCL (PF) 1 % IJ SOLN
INTRAMUSCULAR | Status: DC | PRN
  Administered 2016-10-27: .7 mL

## 2016-10-27 MED ORDER — TETRACAINE HCL 0.5 % OP SOLN
1.0000 [drp] | OPHTHALMIC | Status: AC
  Administered 2016-10-27 (×3): 1 [drp] via OPHTHALMIC

## 2016-10-27 MED ORDER — BSS IO SOLN
INTRAOCULAR | Status: DC | PRN
  Administered 2016-10-27: 15 mL via INTRAOCULAR

## 2016-10-27 MED ORDER — PROVISC 10 MG/ML IO SOLN
INTRAOCULAR | Status: DC | PRN
  Administered 2016-10-27: 0.85 mL via INTRAOCULAR

## 2016-10-27 MED ORDER — EPINEPHRINE PF 1 MG/ML IJ SOLN
INTRAMUSCULAR | Status: AC
  Filled 2016-10-27: qty 1

## 2016-10-27 MED ORDER — POVIDONE-IODINE 5 % OP SOLN
OPHTHALMIC | Status: DC | PRN
  Administered 2016-10-27: 1 via OPHTHALMIC

## 2016-10-27 MED ORDER — NEOMYCIN-POLYMYXIN-DEXAMETH 3.5-10000-0.1 OP SUSP
OPHTHALMIC | Status: DC | PRN
  Administered 2016-10-27: 2 [drp] via OPHTHALMIC

## 2016-10-27 MED ORDER — EPINEPHRINE PF 1 MG/ML IJ SOLN
INTRAOCULAR | Status: DC | PRN
  Administered 2016-10-27: 500 mL

## 2016-10-27 MED ORDER — PHENYLEPHRINE HCL 2.5 % OP SOLN
1.0000 [drp] | OPHTHALMIC | Status: AC
  Administered 2016-10-27 (×3): 1 [drp] via OPHTHALMIC

## 2016-10-27 MED ORDER — MIDAZOLAM HCL 2 MG/2ML IJ SOLN
1.0000 mg | INTRAMUSCULAR | Status: AC
  Administered 2016-10-27: 2 mg via INTRAVENOUS
  Filled 2016-10-27: qty 2

## 2016-10-27 MED ORDER — FENTANYL CITRATE (PF) 100 MCG/2ML IJ SOLN
25.0000 ug | Freq: Once | INTRAMUSCULAR | Status: AC
  Administered 2016-10-27: 25 ug via INTRAVENOUS
  Filled 2016-10-27: qty 2

## 2016-10-27 MED ORDER — CYCLOPENTOLATE-PHENYLEPHRINE 0.2-1 % OP SOLN
1.0000 [drp] | OPHTHALMIC | Status: AC
  Administered 2016-10-27 (×3): 1 [drp] via OPHTHALMIC

## 2016-10-27 SURGICAL SUPPLY — 12 items

## 2016-10-27 NOTE — H&P (Signed)
I have reviewed the H&P, the patient was re-examined, and I have identified no interval changes in medical condition and plan of care since the history and physical of record  

## 2016-10-27 NOTE — Anesthesia Postprocedure Evaluation (Signed)
Anesthesia Post Note  Patient: Troy Bates  Procedure(s) Performed: Procedure(s) (LRB): CATARACT EXTRACTION PHACO AND INTRAOCULAR LENS PLACEMENT LEFT EYE (Left)  Patient location during evaluation: Short Stay Anesthesia Type: MAC Level of consciousness: awake and alert and patient cooperative Pain management: pain level controlled Vital Signs Assessment: post-procedure vital signs reviewed and stable Respiratory status: spontaneous breathing, nonlabored ventilation and respiratory function stable Cardiovascular status: blood pressure returned to baseline Postop Assessment: no signs of nausea or vomiting Anesthetic complications: no     Last Vitals:  Vitals:   10/27/16 0845 10/27/16 0900  BP: (!) 155/94 126/80  Resp: 17 18  Temp:      Last Pain: There were no vitals filed for this visit.               Alyze Lauf,Aisea J

## 2016-10-27 NOTE — Anesthesia Preprocedure Evaluation (Signed)
Anesthesia Evaluation  Patient identified by MRN, date of birth, ID band  Airway Mallampati: I  TM Distance: >3 FB Neck ROM: Full    Dental  (+) Edentulous Upper, Edentulous Lower   Pulmonary shortness of breath and with exertion, asthma , sleep apnea (Level "5"), Continuous Positive Airway Pressure Ventilation and Oxygen sleep apnea , COPD,  COPD inhaler, Current Smoker,     + decreased breath sounds      Cardiovascular hypertension, Pt. on medications  Rhythm:Regular Rate:Normal     Neuro/Psych    GI/Hepatic GERD  Medicated and Controlled,  Endo/Other    Renal/GU      Musculoskeletal   Abdominal Normal abdominal exam  (+)   Peds  Hematology   Anesthesia Other Findings   Reproductive/Obstetrics                             Anesthesia Physical Anesthesia Plan  ASA: IV  Anesthesia Plan: MAC   Post-op Pain Management:    Induction: Intravenous  PONV Risk Score and Plan:   Airway Management Planned: Nasal Cannula  Additional Equipment:   Intra-op Plan:   Post-operative Plan:   Informed Consent:   Plan Discussed with: CRNA  Anesthesia Plan Comments:         Anesthesia Quick Evaluation  

## 2016-10-27 NOTE — Op Note (Signed)
Date of Admission: 10/27/2016  Date of Surgery: 10/27/2016  Pre-Op Dx: Cataract Left  Eye  Post-Op Dx: Senile Nuclear Cataract  Left  Eye,  Dx Code H25.12  Surgeon: Tonny Branch, M.D.  Assistants: None  Anesthesia: Topical with MAC  Indications: Painless, progressive loss of vision with compromise of daily activities.  Surgery: Cataract Extraction with Intraocular lens Implant Left Eye  Discription: The patient had dilating drops and viscous lidocaine placed into the Left eye in the pre-op holding area. After transfer to the operating room, a time out was performed. The patient was then prepped and draped. Beginning with a 41m blade a paracentesis port was made at the surgeon's 2 o'clock position. The anterior chamber was then filled with 1% non-preserved lidocaine. This was followed by filling the anterior chamber with Provisc.  A 2.457mkeratome blade was used to make a clear corneal incision at the temporal limbus.  A bent cystatome needle was used to create a continuous tear capsulotomy. Hydrodissection was performed with balanced salt solution on a Fine canula. The lens nucleus was then removed using the phacoemulsification handpiece. Residual cortex was removed with the I&A handpiece. The anterior chamber and capsular bag were refilled with Provisc. A posterior chamber intraocular lens was placed into the capsular bag with it's injector. The implant was positioned with the Kuglan hook. The Provisc was then removed from the anterior chamber and capsular bag with the I&A handpiece. Stromal hydration of the main incision and paracentesis port was performed with BSS on a Fine canula. The wounds were tested for leak which was negative. The patient tolerated the procedure well. There were no operative complications. The patient was then transferred to the recovery room in stable condition.  Complications: None  Specimen: None  EBL: None  Prosthetic device: Abbott Technis, PCB00, power 23.0, SN  [id number]

## 2016-10-27 NOTE — Discharge Instructions (Signed)
Anesthesia, Adult, Care After °These instructions provide you with information about caring for yourself after your procedure. Your health care provider may also give you more specific instructions. Your treatment has been planned according to current medical practices, but problems sometimes occur. Call your health care provider if you have any problems or questions after your procedure. °What can I expect after the procedure? °After the procedure, it is common to have: °· Vomiting. °· A sore throat. °· Mental slowness. ° °It is common to feel: °· Nauseous. °· Cold or shivery. °· Sleepy. °· Tired. °· Sore or achy, even in parts of your body where you did not have surgery. ° °Follow these instructions at home: °For at least 24 hours after the procedure: °· Do not: °? Participate in activities where you could fall or become injured. °? Drive. °? Use heavy machinery. °? Drink alcohol. °? Take sleeping pills or medicines that cause drowsiness. °? Make important decisions or sign legal documents. °? Take care of children on your own. °· Rest. °Eating and drinking °· If you vomit, drink water, juice, or soup when you can drink without vomiting. °· Drink enough fluid to keep your urine clear or pale yellow. °· Make sure you have little or no nausea before eating solid foods. °· Follow the diet recommended by your health care provider. °General instructions °· Have a responsible adult stay with you until you are awake and alert. °· Return to your normal activities as told by your health care provider. Ask your health care provider what activities are safe for you. °· Take over-the-counter and prescription medicines only as told by your health care provider. °· If you smoke, do not smoke without supervision. °· Keep all follow-up visits as told by your health care provider. This is important. °Contact a health care provider if: °· You continue to have nausea or vomiting at home, and medicines are not helpful. °· You cannot  drink fluids or start eating again. °· You cannot urinate after 8-12 hours. °· You develop a skin rash. °· You have fever. °· You have increasing redness at the site of your procedure. °Get help right away if: °· You have difficulty breathing. °· You have chest pain. °· You have unexpected bleeding. °· You feel that you are having a life-threatening or urgent problem. °This information is not intended to replace advice given to you by your health care provider. Make sure you discuss any questions you have with your health care provider. °Document Released: 06/30/2000 Document Revised: 08/27/2015 Document Reviewed: 03/08/2015 °[employer] Interactive Patient Education © 2018 [employer] ° °

## 2016-10-27 NOTE — Transfer of Care (Signed)
Immediate Anesthesia Transfer of Care Note  Patient: Troy Bates  Procedure(s) Performed: Procedure(s) with comments: CATARACT EXTRACTION PHACO AND INTRAOCULAR LENS PLACEMENT LEFT EYE (Left) - CDE: 14.01  Patient Location: Short Stay  Anesthesia Type:MAC  Level of Consciousness: awake and patient cooperative  Airway & Oxygen Therapy: Patient Spontanous Breathing and Patient connected to nasal cannula oxygen  Post-op Assessment: Report given to RN, Post -op Vital signs reviewed and stable and Patient moving all extremities  Post vital signs: Reviewed and stable  Last Vitals:  Vitals:   10/27/16 0845 10/27/16 0900  BP: (!) 155/94 126/80  Resp: 17 18  Temp:      Last Pain: There were no vitals filed for this visit.       Complications: No apparent anesthesia complications

## 2016-10-28 ENCOUNTER — Encounter (HOSPITAL_COMMUNITY): Payer: Self-pay | Admitting: Ophthalmology

## 2016-12-25 IMAGING — CR DG CHEST 2V
2 series · 2 of 2 positions shown · non-contrast
Comparison: Chest CT 10/18/2014 and radiograph 10/12/2014

CLINICAL DATA: COPD.  Chronic shortness of breath.  Smoker.

EXAM:
CHEST  2 VIEW

[view not recorded (1 of 2)]
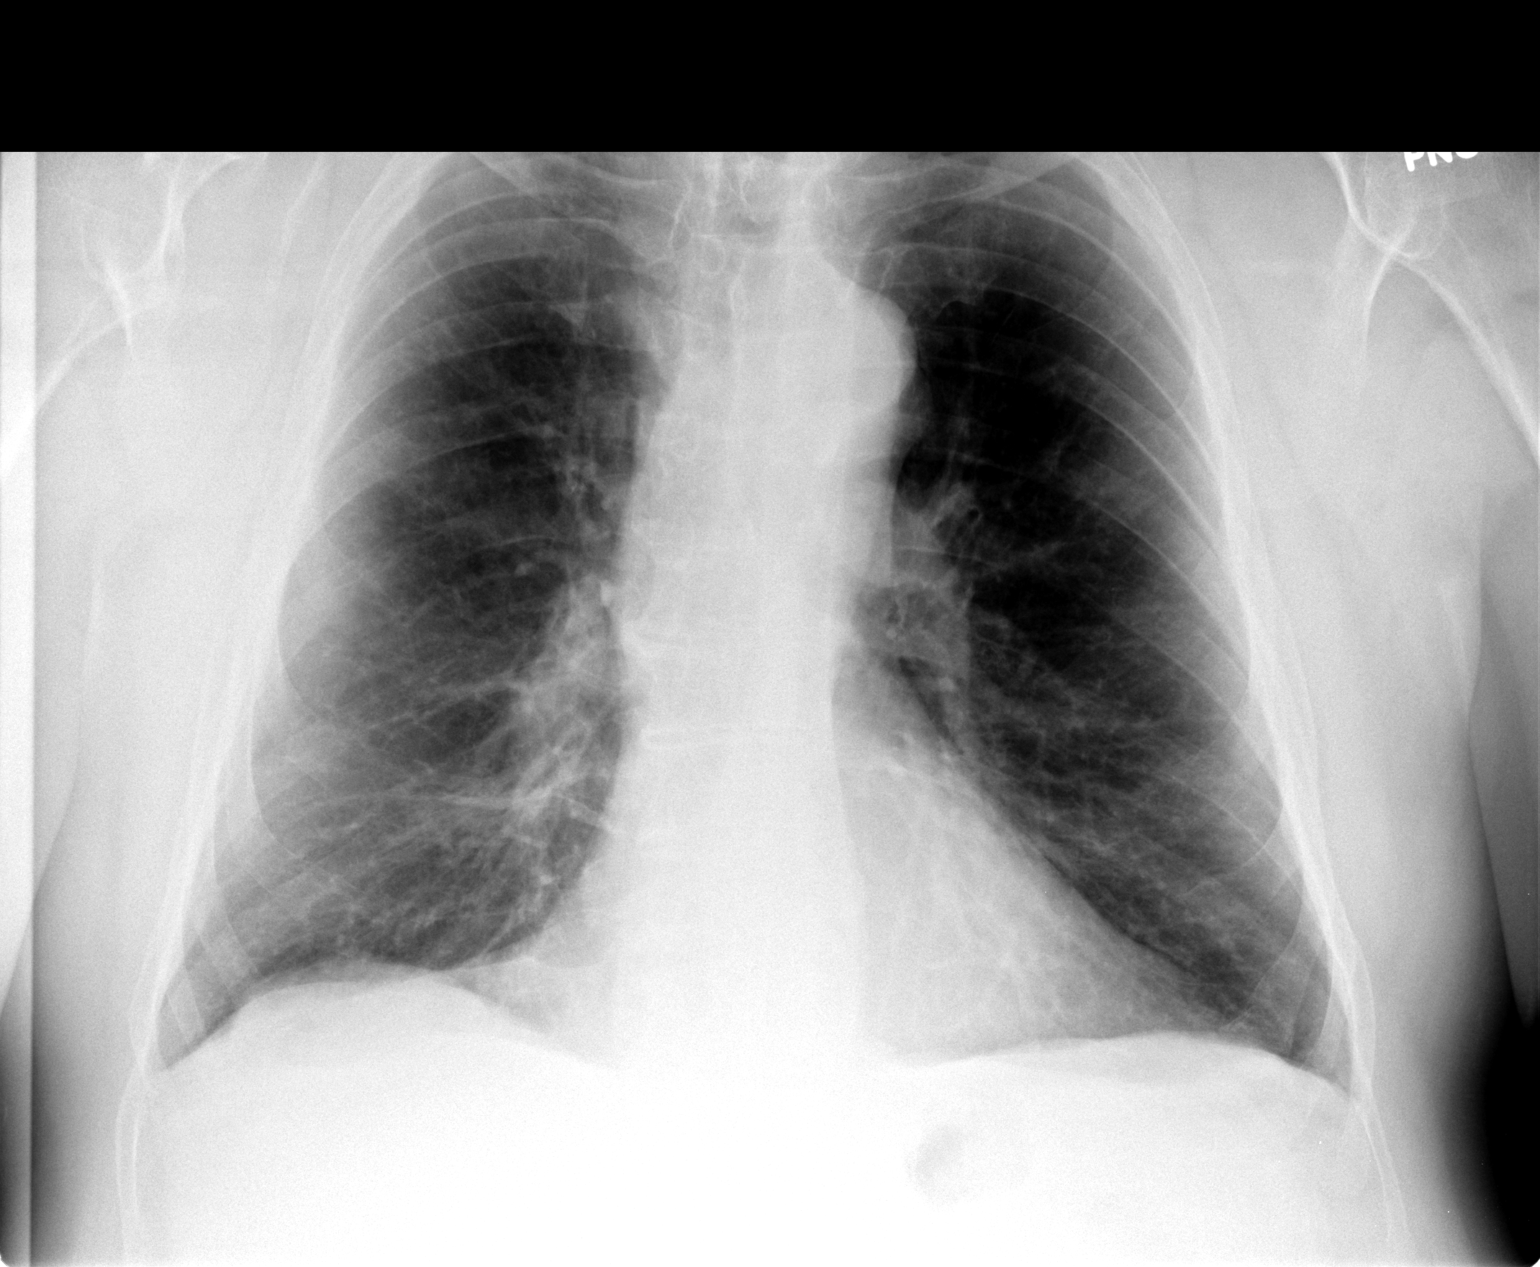

[view not recorded (2 of 2)]
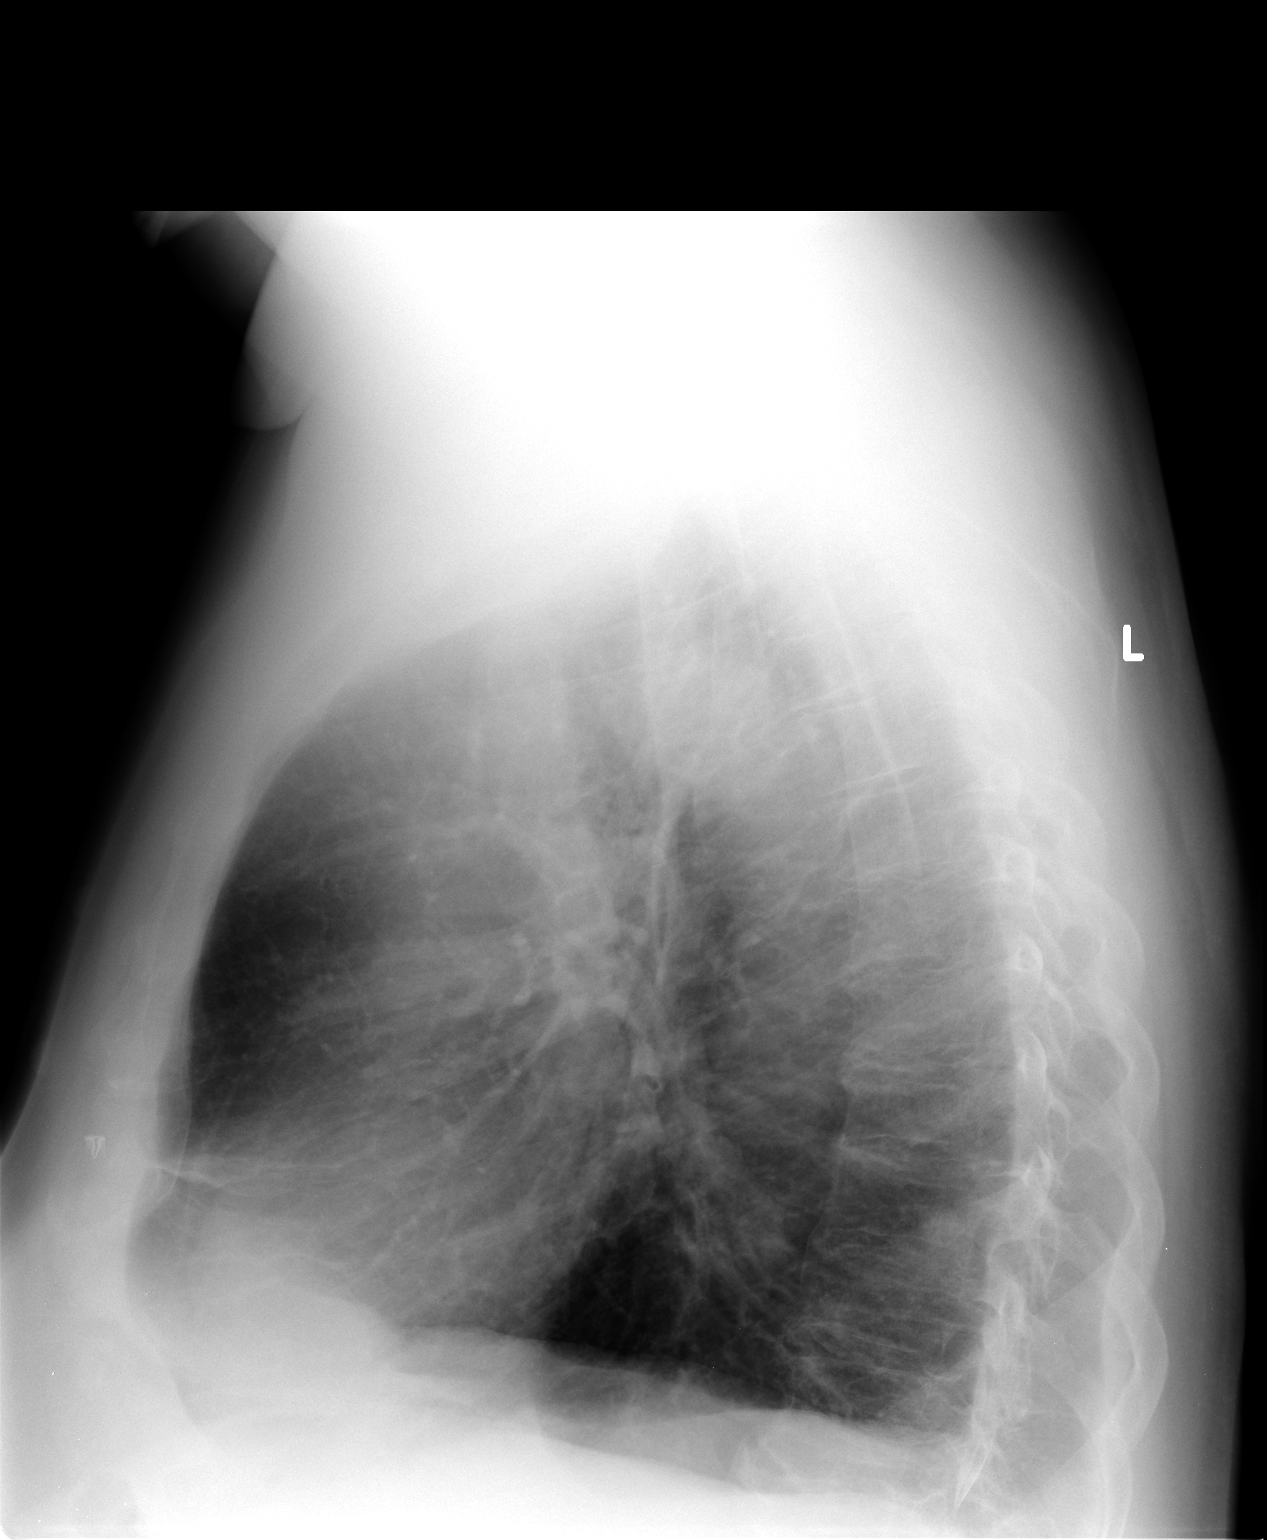

[2 of 2 positions shown; findings below may reference images not displayed]

FINDINGS: Cardiomediastinal silhouette is unchanged and within normal limits.
Lungs remain hyperinflated with chronic coarsening of the
interstitial markings consistent with underlying COPD. Mild scarring
or subsegmental atelectasis is again seen in the lung bases.

Faint rounded density projecting over the lower thoracic spine on
the lateral radiograph may correspond to the right lower lobe
cavitary lesion on recent chest CT with overall extent of opacity in
this region likely less than on the prior radiographs. There is no
evidence of new airspace consolidation, edema, pleural effusion, or
pneumothorax. Mild thoracic dextroscoliosis is again seen. No acute
osseous abnormality.
IMPRESSION: 1. Faint density likely corresponding to the right lower lobe
cavitary nodule on the recent chest CT, possibly improved compared
to the prior radiographs.
2. COPD.  No evidence of new abnormality in the chest.

## 2017-03-03 IMAGING — CR DG CHEST 1V PORT
1 series · 1 of 1 positions shown · non-contrast
Comparison: CT chest 10/18/2014.  PA and lateral chest 10/27/2014.

CLINICAL DATA: Productive cough, congestion and shortness of breath
for 2 weeks.

EXAM:
PORTABLE CHEST 1 VIEW

[ap portable]
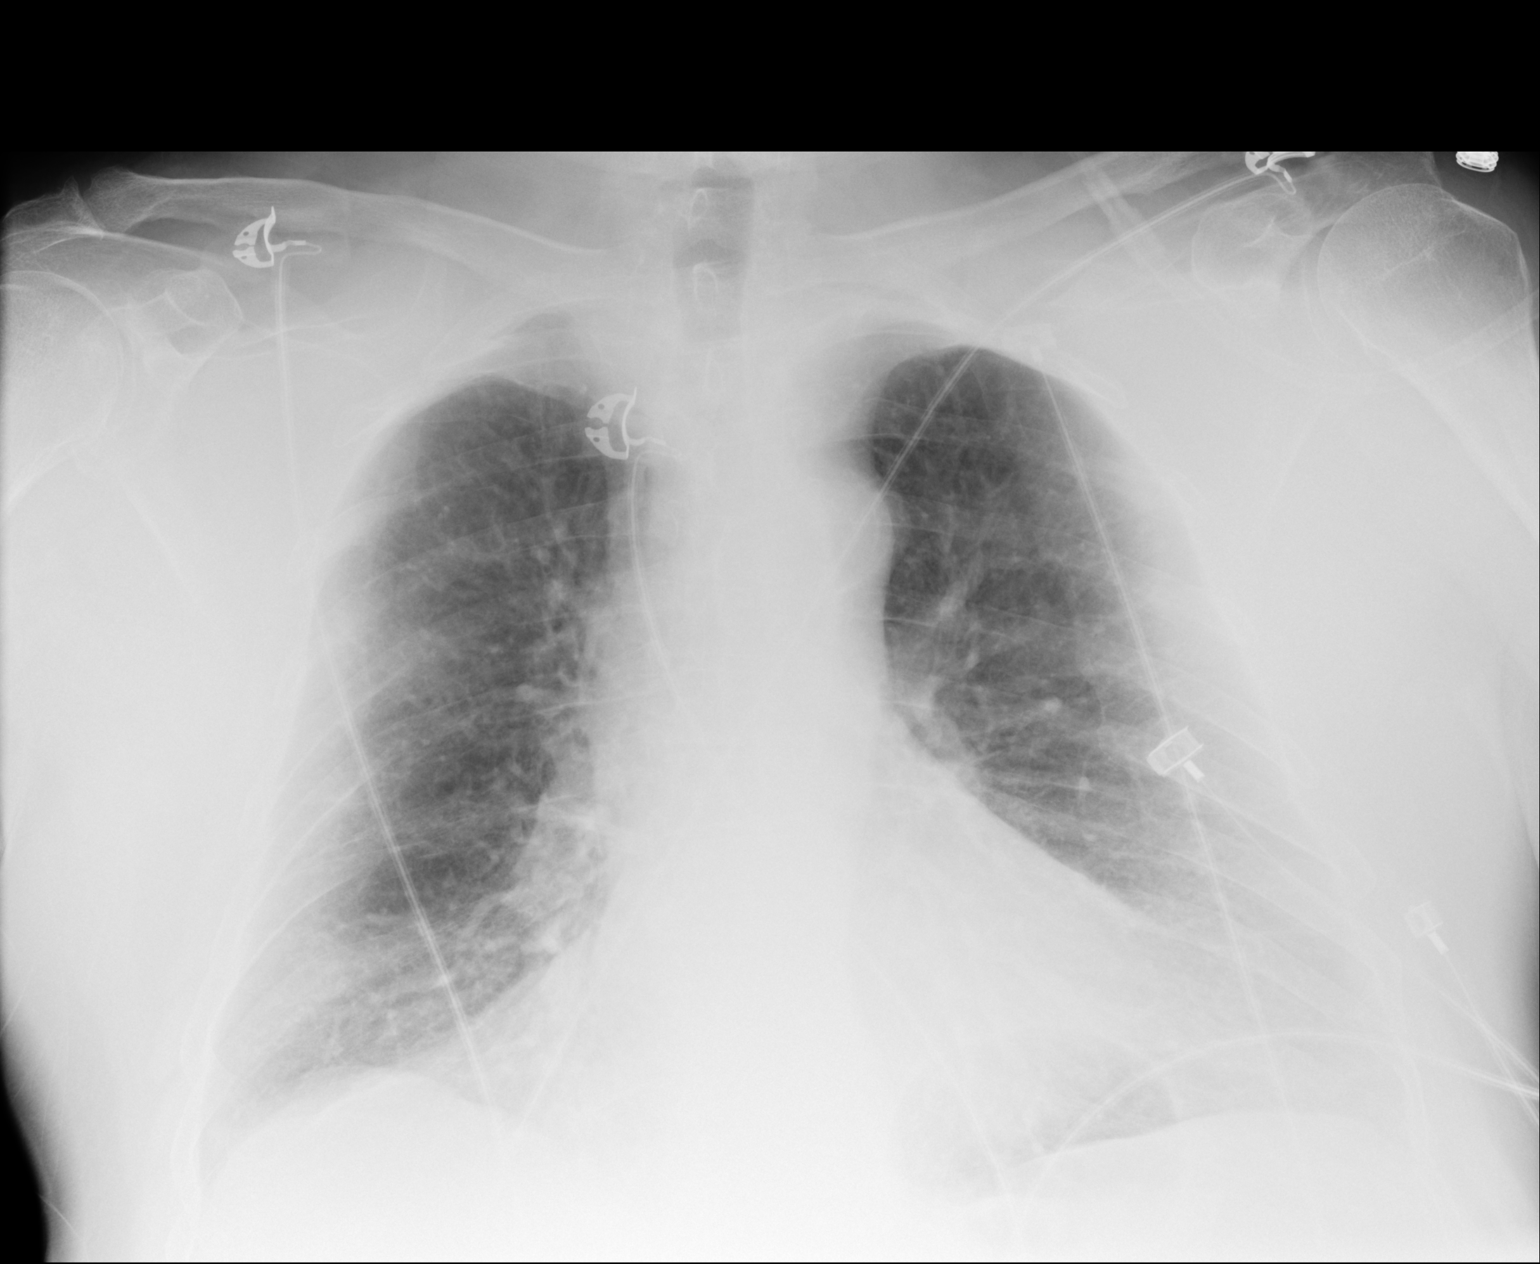

[1 of 1 positions shown; findings below may reference images not displayed]

FINDINGS: The lungs are emphysematous but clear. No pneumothorax or pleural
effusion. Heart size normal.
IMPRESSION: Emphysema without acute disease.

## 2017-08-28 IMAGING — US US EXTREM LOW VENOUS*R*
1 series · 13 of 24 positions shown · non-contrast
Comparison: None.

CLINICAL DATA: Chronic right lower extremity edema.



[Series 1: us extrem low venous*right* · 0.09mm/px · 13 of 34 slices shown]
[im 1/34]
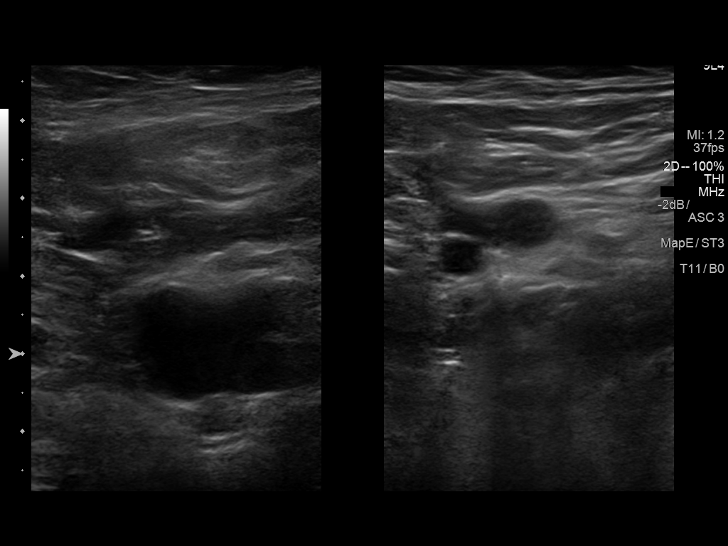
[im 3/34]
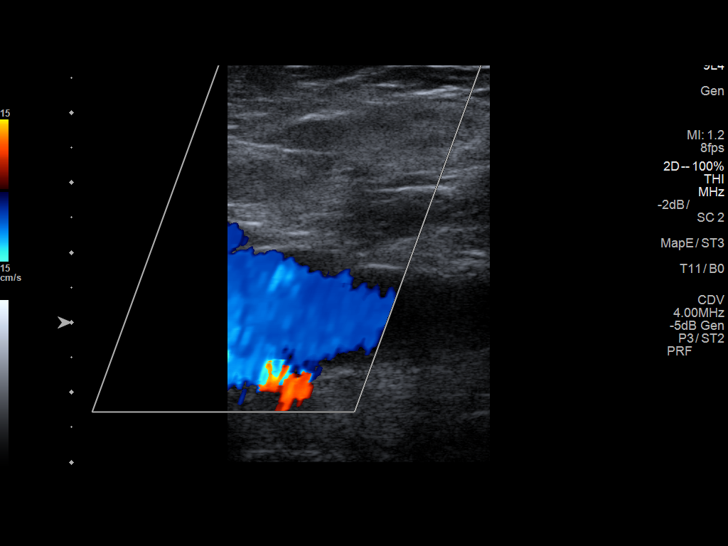
[im 6/34]
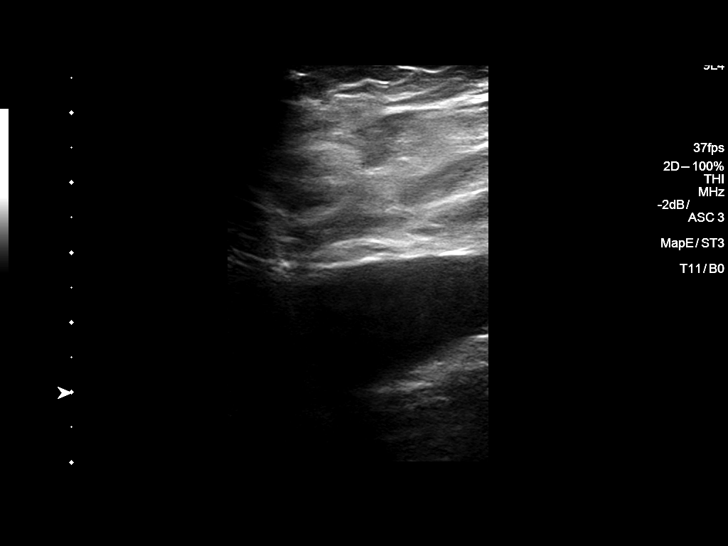
[im 9/34]
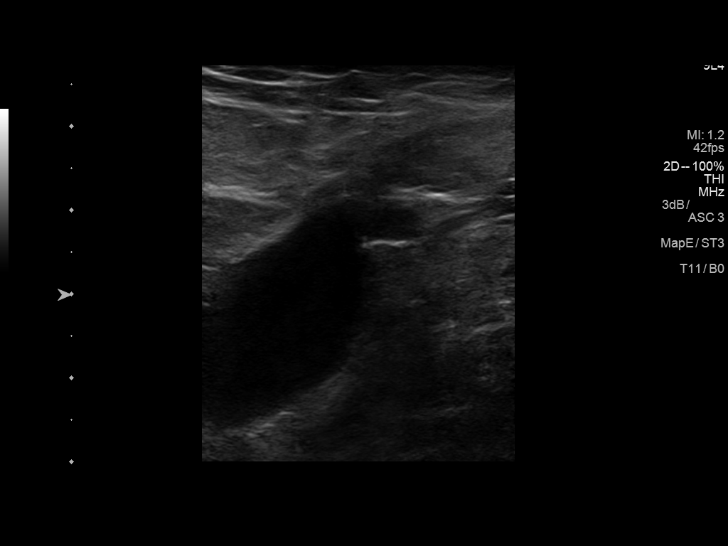
[im 12/34]
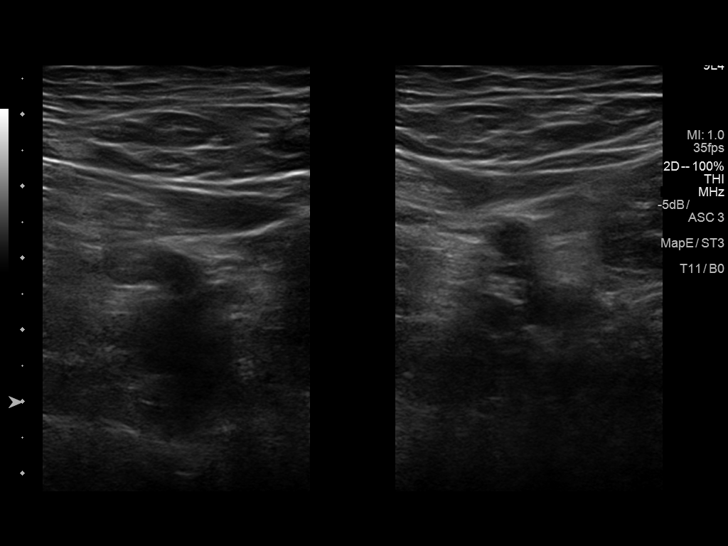
[im 15/34]
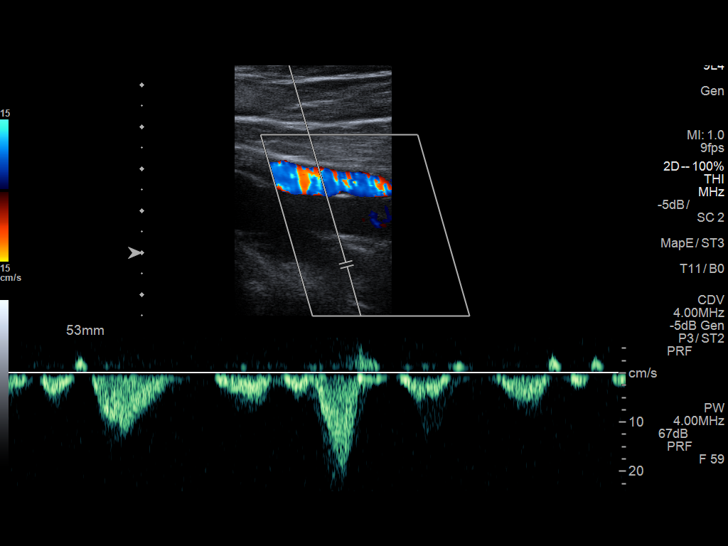
[im 18/34]
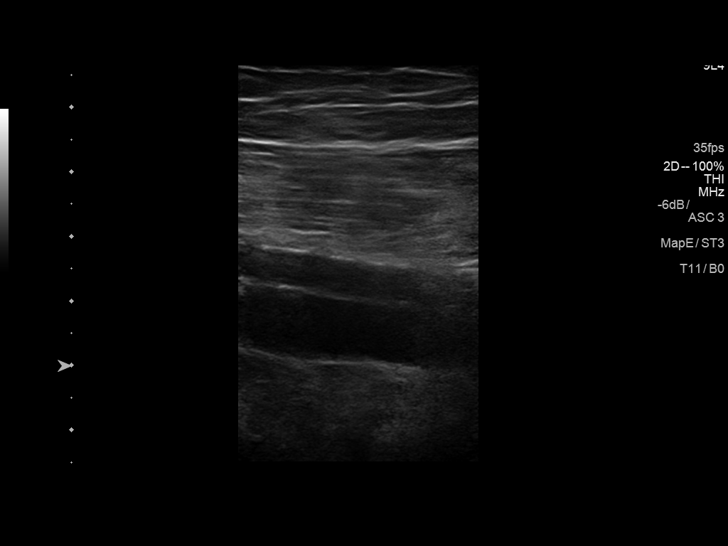
[im 19/34]
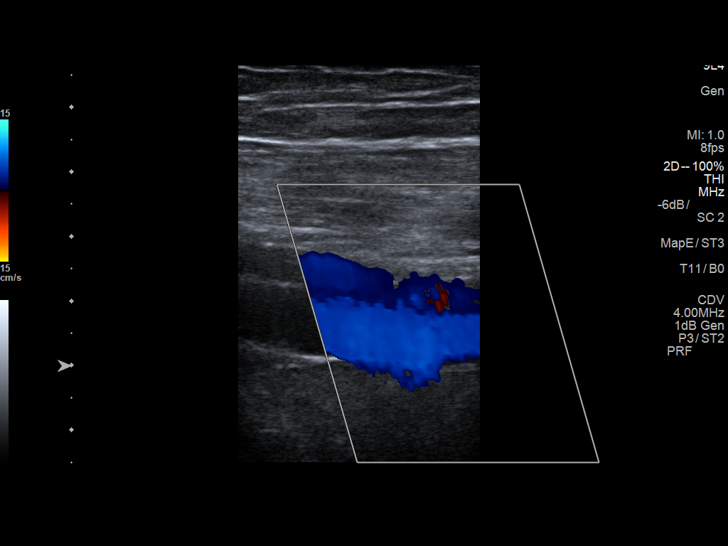
[im 22/34]
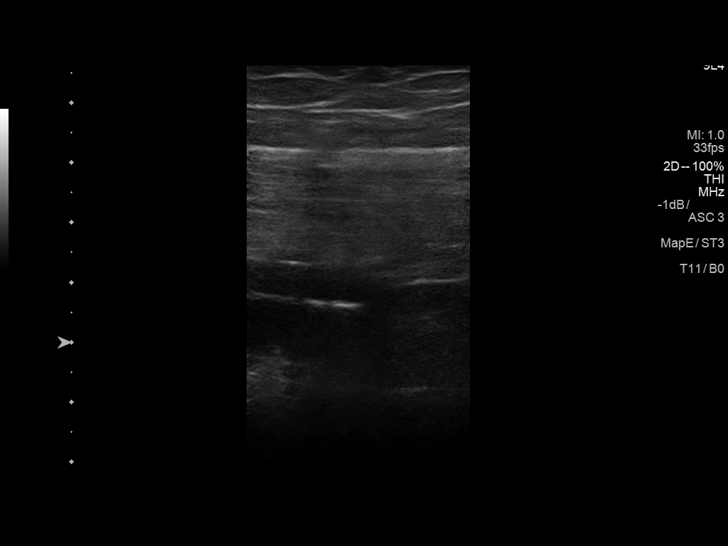
[im 25/34]
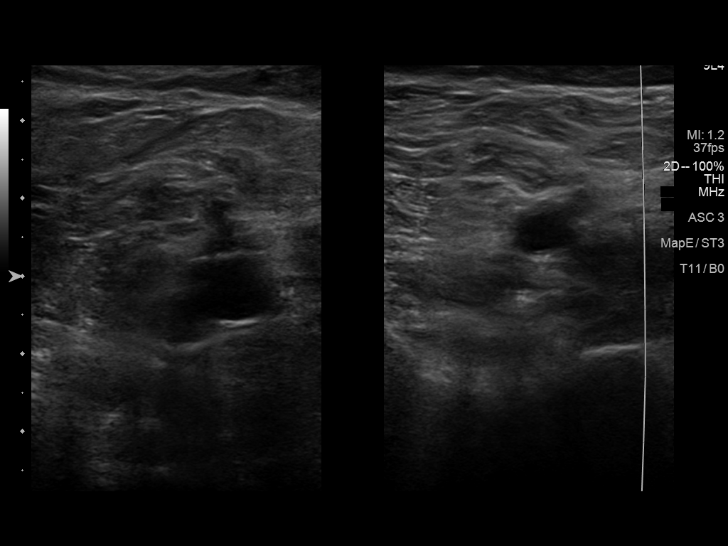
[im 28/34]
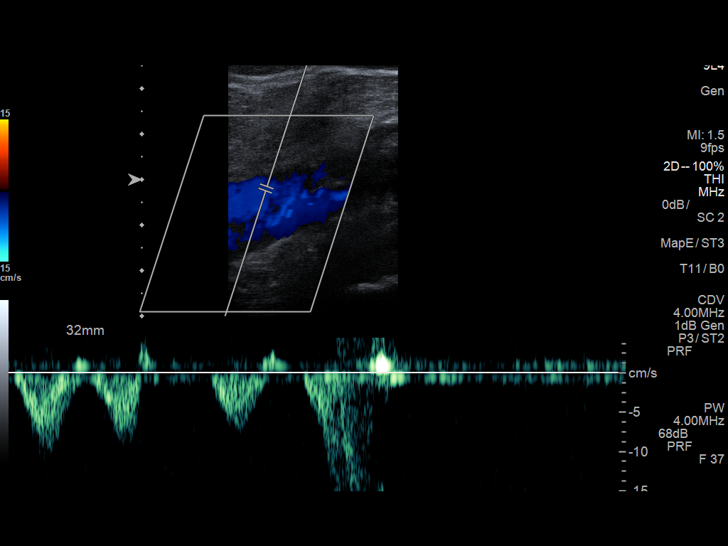
[im 31/34]
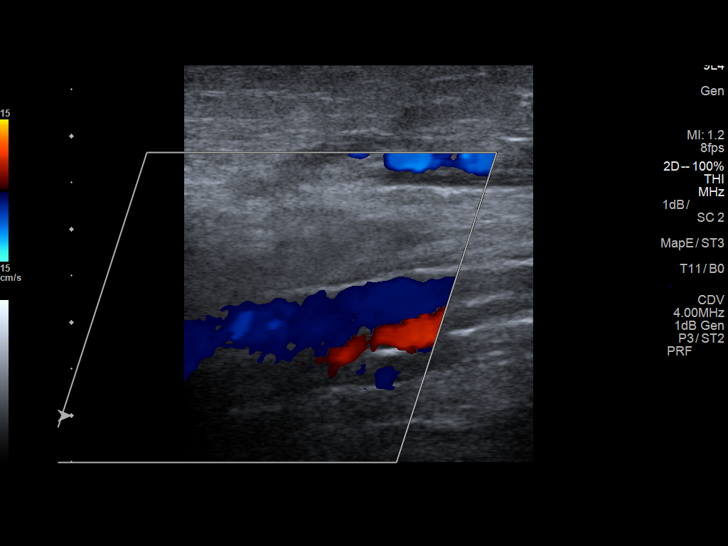
[im 34/34]
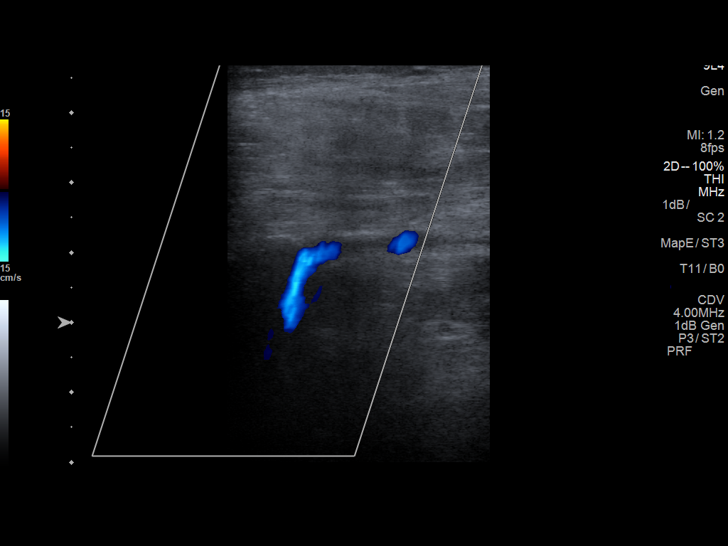

[13 of 24 positions shown; findings below may reference images not displayed]

FINDINGS: Contralateral Common Femoral Vein: Respiratory phasicity is normal
and symmetric with the symptomatic side. No evidence of thrombus.
Normal compressibility.

Common Femoral Vein: No evidence of thrombus. Normal
compressibility, respiratory phasicity and response to augmentation.

Saphenofemoral Junction: No evidence of thrombus. Normal
compressibility and flow on color Doppler imaging.

Profunda Femoral Vein: No evidence of thrombus. Normal
compressibility and flow on color Doppler imaging.

Femoral Vein: No evidence of thrombus. Normal compressibility,
respiratory phasicity and response to augmentation.

Popliteal Vein: No evidence of thrombus. Normal compressibility,
respiratory phasicity and response to augmentation.

Calf Veins: No evidence of thrombus. Normal compressibility and flow
on color Doppler imaging.

Superficial Great Saphenous Vein: No evidence of thrombus. Normal
compressibility and flow on color Doppler imaging.

Venous Reflux:  None.

Other Findings:  None.
IMPRESSION: No evidence of deep venous thrombosis seen in right lower extremity.

## 2019-12-07 DEATH — deceased
